# Patient Record
Sex: Female | Born: 1970 | Race: Black or African American | Hispanic: No | Marital: Married | Smoking: Never smoker
Health system: Southern US, Community
[De-identification: ages and names within clinical notes are randomized; demographics above are authoritative.]

## PROBLEM LIST (undated history)

## (undated) DIAGNOSIS — F3181 Bipolar II disorder: Secondary | ICD-10-CM

## (undated) DIAGNOSIS — I10 Essential (primary) hypertension: Secondary | ICD-10-CM

## (undated) DIAGNOSIS — N189 Chronic kidney disease, unspecified: Secondary | ICD-10-CM

## (undated) DIAGNOSIS — G4730 Sleep apnea, unspecified: Secondary | ICD-10-CM

## (undated) DIAGNOSIS — E669 Obesity, unspecified: Secondary | ICD-10-CM

## (undated) DIAGNOSIS — C4492 Squamous cell carcinoma of skin, unspecified: Secondary | ICD-10-CM

## (undated) DIAGNOSIS — E785 Hyperlipidemia, unspecified: Secondary | ICD-10-CM

## (undated) HISTORY — PX: CERVICAL CERCLAGE: SHX1329

## (undated) HISTORY — DX: Bipolar II disorder: F31.81

## (undated) HISTORY — DX: Hyperlipidemia, unspecified: E78.5

## (undated) HISTORY — PX: KIDNEY TRANSPLANT: SHX239

## (undated) HISTORY — DX: Squamous cell carcinoma of skin, unspecified: C44.92

## (undated) HISTORY — DX: Obesity, unspecified: E66.9

---

## 2010-11-11 DIAGNOSIS — N19 Unspecified kidney failure: Secondary | ICD-10-CM | POA: Diagnosis present

## 2010-11-11 DIAGNOSIS — N920 Excessive and frequent menstruation with regular cycle: Secondary | ICD-10-CM | POA: Diagnosis present

## 2010-12-25 ENCOUNTER — Other Ambulatory Visit: Payer: Self-pay

## 2010-12-25 ENCOUNTER — Encounter (HOSPITAL_COMMUNITY)
Admission: RE | Admit: 2010-12-25 | Discharge: 2010-12-25 | Disposition: A | Discharge status: Home / Self Care | Payer: BC Managed Care – PPO | Source: Ambulatory Visit | Attending: Obstetrics and Gynecology | Admitting: Obstetrics and Gynecology

## 2010-12-25 ENCOUNTER — Encounter (HOSPITAL_COMMUNITY): Payer: Self-pay

## 2010-12-25 HISTORY — DX: Sleep apnea, unspecified: G47.30

## 2010-12-25 HISTORY — DX: Chronic kidney disease, unspecified: N18.9

## 2010-12-25 HISTORY — DX: Essential (primary) hypertension: I10

## 2010-12-25 LAB — CBC
HCT: 32.6 % — ABNORMAL LOW (ref 36.0–46.0)
MCH: 30.4 pg (ref 26.0–34.0)
MCHC: 31.3 g/dL (ref 30.0–36.0)
MCV: 97 fL (ref 78.0–100.0)
Platelets: 223 10*3/uL (ref 150–400)
RDW: 12.9 % (ref 11.5–15.5)
WBC: 6 10*3/uL (ref 4.0–10.5)

## 2010-12-25 LAB — BASIC METABOLIC PANEL
BUN: 42 mg/dL — ABNORMAL HIGH (ref 6–23)
CO2: 24 mEq/L (ref 19–32)
Calcium: 9.4 mg/dL (ref 8.4–10.5)
Chloride: 102 mEq/L (ref 96–112)
Creatinine, Ser: 4.34 mg/dL — ABNORMAL HIGH (ref 0.50–1.10)

## 2010-12-25 NOTE — Patient Instructions (Signed)
   Your procedure is scheduled on:01/01/11  Enter through the Main Entrance of [facility] at:0600 Pick up the phone at the desk and dial [phone] and inform us of your arrival  Please call this number if you have any problems the morning of surgery: [phone]  Remember: Do not eat food after midnight  Do not drink clear liquids after:midnight Take these medicines the morning of surgery with a SIP OF WATER: take all BP meds  Do not wear jewelry, make-up, or FINGER nail polish Do not wear lotions, powders, or perfumes.  You may wear deodorant. Do not shave 48 hours prior to surgery. Do not bring valuables to the hospital. Leave suitcase in the car. After Surgery it may be brought to your room. For patients being admitted to the hospital, checkout time is 11:00am the day of discharge.  Patients discharged on the day of surgery will not be allowed to drive home.   Name and phone number of your driver: Marlan Palau- [phone]   Remember to use your hibiclens as instructed.Please shower with 1/2 bottle the evening before your surgery and the other 1/2 bottle the morning of surgery.

## 2010-12-25 NOTE — Pre-Procedure Instructions (Signed)
Records from nephrologist are in hard chart under "anesthesia" tab.

## 2010-12-27 LAB — MRSA CULTURE

## 2010-12-30 NOTE — Pre-Procedure Instructions (Signed)
12/25/10 PCR results invalid- spec sent for culture. On 12/29/10, I checked EPIC chart for culture results and could not discern the results so I went to echart - results read"  no MRSA No Staph.aureus isolated. '

## 2011-01-01 ENCOUNTER — Encounter (HOSPITAL_COMMUNITY)
Admission: RE | Disposition: A | Discharge status: Home / Self Care | Payer: Self-pay | Source: Ambulatory Visit | Attending: Obstetrics and Gynecology

## 2011-01-01 ENCOUNTER — Encounter (HOSPITAL_COMMUNITY): Payer: Self-pay | Admitting: Anesthesiology

## 2011-01-01 ENCOUNTER — Ambulatory Visit (HOSPITAL_COMMUNITY): Payer: BC Managed Care – PPO | Admitting: Anesthesiology

## 2011-01-01 ENCOUNTER — Ambulatory Visit (HOSPITAL_COMMUNITY)
Admission: RE | Admit: 2011-01-01 | Discharge: 2011-01-01 | Disposition: A | Discharge status: Home / Self Care | Payer: BC Managed Care – PPO | Source: Ambulatory Visit | Attending: Obstetrics and Gynecology | Admitting: Obstetrics and Gynecology

## 2011-01-01 ENCOUNTER — Other Ambulatory Visit: Payer: Self-pay | Admitting: Obstetrics and Gynecology

## 2011-01-01 DIAGNOSIS — Z01818 Encounter for other preprocedural examination: Secondary | ICD-10-CM | POA: Insufficient documentation

## 2011-01-01 DIAGNOSIS — N19 Unspecified kidney failure: Secondary | ICD-10-CM | POA: Diagnosis present

## 2011-01-01 DIAGNOSIS — Z01812 Encounter for preprocedural laboratory examination: Secondary | ICD-10-CM | POA: Insufficient documentation

## 2011-01-01 DIAGNOSIS — I10 Essential (primary) hypertension: Secondary | ICD-10-CM | POA: Insufficient documentation

## 2011-01-01 DIAGNOSIS — Z302 Encounter for sterilization: Secondary | ICD-10-CM | POA: Insufficient documentation

## 2011-01-01 DIAGNOSIS — N920 Excessive and frequent menstruation with regular cycle: Secondary | ICD-10-CM | POA: Diagnosis present

## 2011-01-01 HISTORY — PX: LAPAROSCOPIC TUBAL LIGATION: SHX1937

## 2011-01-01 HISTORY — PX: HYSTEROSCOPY WITH D & C: SHX1775

## 2011-01-01 SURGERY — LIGATION, FALLOPIAN TUBE, LAPAROSCOPIC
Anesthesia: General | Site: Vagina | Wound class: Clean Contaminated

## 2011-01-01 MED ORDER — NEOSTIGMINE METHYLSULFATE 1 MG/ML IJ SOLN
INTRAMUSCULAR | Status: AC
  Filled 2011-01-01: qty 10

## 2011-01-01 MED ORDER — PROPOFOL 10 MG/ML IV EMUL
INTRAVENOUS | Status: AC
  Filled 2011-01-01: qty 20

## 2011-01-01 MED ORDER — GLYCINE 1.5 % IR SOLN
Status: DC | PRN
  Administered 2011-01-01: 3000 mL

## 2011-01-01 MED ORDER — SUCCINYLCHOLINE CHLORIDE 20 MG/ML IJ SOLN
INTRAMUSCULAR | Status: AC
  Filled 2011-01-01: qty 1

## 2011-01-01 MED ORDER — ONDANSETRON HCL 4 MG/2ML IJ SOLN
INTRAMUSCULAR | Status: DC | PRN
  Administered 2011-01-01: 4 mg via INTRAVENOUS

## 2011-01-01 MED ORDER — BUPIVACAINE HCL (PF) 0.25 % IJ SOLN
INTRAMUSCULAR | Status: DC | PRN
  Administered 2011-01-01: 5 mL
  Administered 2011-01-01: 12 mL

## 2011-01-01 MED ORDER — ACETAMINOPHEN 10 MG/ML IV SOLN
1000.0000 mg | Freq: Once | INTRAVENOUS | Status: AC | PRN
  Administered 2011-01-01: 1000 mg via INTRAVENOUS
  Filled 2011-01-01: qty 100

## 2011-01-01 MED ORDER — GLYCOPYRROLATE 0.2 MG/ML IJ SOLN
INTRAMUSCULAR | Status: DC | PRN
  Administered 2011-01-01: 0.1 mg via INTRAVENOUS

## 2011-01-01 MED ORDER — DEXAMETHASONE SODIUM PHOSPHATE 10 MG/ML IJ SOLN
INTRAMUSCULAR | Status: AC
  Filled 2011-01-01: qty 1

## 2011-01-01 MED ORDER — ONDANSETRON HCL 4 MG/2ML IJ SOLN
INTRAMUSCULAR | Status: AC
  Filled 2011-01-01: qty 2

## 2011-01-01 MED ORDER — GLYCOPYRROLATE 0.2 MG/ML IJ SOLN
INTRAMUSCULAR | Status: AC
  Filled 2011-01-01: qty 2

## 2011-01-01 MED ORDER — FENTANYL CITRATE 0.05 MG/ML IJ SOLN
INTRAMUSCULAR | Status: AC
  Filled 2011-01-01: qty 5

## 2011-01-01 MED ORDER — DEXAMETHASONE SODIUM PHOSPHATE 10 MG/ML IJ SOLN
INTRAMUSCULAR | Status: DC | PRN
  Administered 2011-01-01: 10 mg via INTRAVENOUS

## 2011-01-01 MED ORDER — MIDAZOLAM HCL 2 MG/2ML IJ SOLN
INTRAMUSCULAR | Status: AC
  Filled 2011-01-01: qty 2

## 2011-01-01 MED ORDER — LACTATED RINGERS IV SOLN
INTRAVENOUS | Status: DC
  Administered 2011-01-01: 07:00:00 via INTRAVENOUS

## 2011-01-01 MED ORDER — PROPOFOL 10 MG/ML IV EMUL
INTRAVENOUS | Status: DC | PRN
  Administered 2011-01-01: 200 mg via INTRAVENOUS
  Administered 2011-01-01: 100 mg via INTRAVENOUS

## 2011-01-01 MED ORDER — LIDOCAINE HCL (CARDIAC) 20 MG/ML IV SOLN
INTRAVENOUS | Status: AC
  Filled 2011-01-01: qty 5

## 2011-01-01 MED ORDER — FENTANYL CITRATE 0.05 MG/ML IJ SOLN
INTRAMUSCULAR | Status: DC | PRN
  Administered 2011-01-01 (×3): 50 ug via INTRAVENOUS

## 2011-01-01 MED ORDER — LIDOCAINE HCL (CARDIAC) 20 MG/ML IV SOLN
INTRAVENOUS | Status: DC | PRN
  Administered 2011-01-01: 100 mg via INTRAVENOUS

## 2011-01-01 MED ORDER — CEFAZOLIN SODIUM 1-5 GM-% IV SOLN
INTRAVENOUS | Status: DC | PRN
  Administered 2011-01-01: 1 g via INTRAVENOUS

## 2011-01-01 MED ORDER — CEFAZOLIN SODIUM 1-5 GM-% IV SOLN
INTRAVENOUS | Status: AC
  Filled 2011-01-01: qty 50

## 2011-01-01 MED ORDER — ROCURONIUM BROMIDE 50 MG/5ML IV SOLN
INTRAVENOUS | Status: AC
  Filled 2011-01-01: qty 1

## 2011-01-01 MED ORDER — MIDAZOLAM HCL 5 MG/5ML IJ SOLN
INTRAMUSCULAR | Status: DC | PRN
  Administered 2011-01-01: 1 mg via INTRAVENOUS

## 2011-01-01 MED ORDER — LIDOCAINE HCL 1 % IJ SOLN
INTRAMUSCULAR | Status: DC | PRN
  Administered 2011-01-01: 20 mL

## 2011-01-01 SURGICAL SUPPLY — 27 items
CANISTER SUCTION 2500CC (MISCELLANEOUS) ×3 IMPLANT
CATH ROBINSON RED A/P 16FR (CATHETERS) ×3 IMPLANT
CLIP FILSHIE TUBAL LIGA STRL (Clip) ×3 IMPLANT
CLOTH BEACON ORANGE TIMEOUT ST (SAFETY) ×3 IMPLANT
CONTAINER PREFILL 10% NBF 60ML (FORM) ×3 IMPLANT
DERMABOND ADVANCED (GAUZE/BANDAGES/DRESSINGS) ×1
DERMABOND ADVANCED .7 DNX12 (GAUZE/BANDAGES/DRESSINGS) ×2 IMPLANT
DRAPE UTILITY XL STRL (DRAPES) ×3 IMPLANT
ELECT REM PT RETURN 9FT ADLT (ELECTROSURGICAL)
ELECTRODE REM PT RTRN 9FT ADLT (ELECTROSURGICAL) IMPLANT
GLOVE BIO SURGEON STRL SZ8 (GLOVE) ×6 IMPLANT
GOWN PREVENTION PLUS LG XLONG (DISPOSABLE) ×3 IMPLANT
GOWN STRL REIN XL XLG (GOWN DISPOSABLE) ×3 IMPLANT
LOOP ANGLED CUTTING 22FR (CUTTING LOOP) IMPLANT
NEEDLE INSUFFLATION 14GA 120MM (NEEDLE) ×3 IMPLANT
PACK HYSTEROSCOPY LF (CUSTOM PROCEDURE TRAY) ×3 IMPLANT
PACK LAPAROSCOPY BASIN (CUSTOM PROCEDURE TRAY) ×3 IMPLANT
STRIP CLOSURE SKIN 1/4X3 (GAUZE/BANDAGES/DRESSINGS) ×3 IMPLANT
SUT VIC AB 3-0 PS2 18 (SUTURE) ×1
SUT VIC AB 3-0 PS2 18XBRD (SUTURE) ×2 IMPLANT
SUT VICRYL 0 UR6 27IN ABS (SUTURE) IMPLANT
SYR 30ML LL (SYRINGE) ×3 IMPLANT
TOWEL OR 17X24 6PK STRL BLUE (TOWEL DISPOSABLE) ×6 IMPLANT
TROCAR XCEL NON-BLD 11X100MML (ENDOMECHANICALS) ×3 IMPLANT
TROCAR XCEL NON-BLD 5MMX100MML (ENDOMECHANICALS) ×3 IMPLANT
WARMER LAPAROSCOPE (MISCELLANEOUS) ×3 IMPLANT
WATER STERILE IRR 1000ML POUR (IV SOLUTION) ×3 IMPLANT

## 2011-01-01 NOTE — Anesthesia Procedure Notes (Addendum)
Performed by: Karleen Dolphin   Procedure Name: Intubation Date/Time: 01/01/2011 7:41 AM Performed by: Karleen Dolphin Pre-anesthesia Checklist: Patient identified, Timeout performed, Emergency Drugs available, Suction available and Patient being monitored Patient Re-evaluated:Patient Re-evaluated prior to inductionOxygen Delivery Method: Circle System Utilized Preoxygenation: Pre-oxygenation with 100% oxygen Intubation Type: IV induction Ventilation: Mask ventilation without difficulty Laryngoscope Size: Mac and 3 Grade View: Grade I Tube type: Oral Tube size: 7.0 mm Number of attempts: 1 Airway Equipment and Method: stylet Placement Confirmation: ETT inserted through vocal cords under direct vision,  positive ETCO2 and breath sounds checked- equal and bilateral Secured at: 21 cm Tube secured with: Tape Dental Injury: Teeth and Oropharynx as per pre-operative assessment

## 2011-01-01 NOTE — H&P (Signed)
  40 yo with heavy menses and U/S C/W probable endometrial polyp desires permanent sterilization. Risks D/W pt preoperatively. PMH-CHTN, Kidney dx, depression Meds - nifedipine, labetolol, lipitor, depakote, torsemide, levocarnitine NKDA  VSS Afeb Lungs CTA Cor RRR Abd soft NT Ut upper normal size, NT  A: Menorrhagia     Desires permanent sterilization  P: H/S, D&C, L/S with BTL

## 2011-01-01 NOTE — Transfer of Care (Signed)
Immediate Anesthesia Transfer of Care Note  Patient: Carolyn Chang  Procedure(s) Performed:  LAPAROSCOPIC TUBAL LIGATION - Lysis of Adhesions; DILATATION AND CURETTAGE (D&C) /HYSTEROSCOPY  Patient Location: PACU  Anesthesia Type: General  Level of Consciousness: awake, alert  and oriented  Airway & Oxygen Therapy: Patient Spontanous Breathing and Patient connected to nasal cannula oxygen  Post-op Assessment: Report given to PACU RN and Post -op Vital signs reviewed and stable  Post vital signs: Reviewed and stable  Complications: No apparent anesthesia complications

## 2011-01-01 NOTE — Anesthesia Preprocedure Evaluation (Signed)
Anesthesia Evaluation  Name, MR# and DOB Patient awake  General Assessment Comment  Reviewed: Allergy & Precautions, H&P , NPO status , Patient's Chart, lab work & pertinent test results, reviewed documented beta blocker date and time   Airway Mallampati: II TM Distance: >3 FB Neck ROM: full    Dental No notable dental hx. (+) Teeth Intact   Pulmonary sleep apnea    Pulmonary exam normal       Cardiovascular hypertension, Pt. on home beta blockers and Pt. on medications     Neuro/Psych Negative Neurological ROS  Negative Psych ROS   GI/Hepatic negative GI ROS Neg liver ROS    Endo/Other  Negative Endocrine ROS  Renal/GU CRFRenal disease  Genitourinary negative   Musculoskeletal negative musculoskeletal ROS (+)   Abdominal Normal abdominal exam  (+)   Peds negative pediatric ROS (+)  Hematology negative hematology ROS (+)   Anesthesia Other Findings   Reproductive/Obstetrics negative OB ROS                           Anesthesia Physical Anesthesia Plan  ASA: III  Anesthesia Plan: General   Post-op Pain Management:    Induction: Intravenous  Airway Management Planned: Oral ETT  Additional Equipment:   Intra-op Plan:   Post-operative Plan: Extubation in OR  Informed Consent: I have reviewed the patients History and Physical, chart, labs and discussed the procedure including the risks, benefits and alternatives for the proposed anesthesia with the patient or authorized representative who has indicated his/her understanding and acceptance.   Dental Advisory Given  Plan Discussed with: CRNA  Anesthesia Plan Comments:         Anesthesia Quick Evaluation

## 2011-01-01 NOTE — Op Note (Signed)
Carolyn Chang, Carolyn Chang            ACCOUNT NO.:  [id number]  MEDICAL RECORD NO.:  [id number]  LOCATION:  [facility]                          FACILITY:  [facility]  PHYSICIAN:  Guy Sandifer. Henderson Cloud, M.D. DATE OF BIRTH:  1970/04/19  DATE OF PROCEDURE:  01/01/2011 DATE OF DISCHARGE:                              OPERATIVE REPORT   PREOPERATIVE DIAGNOSES: 1. Menorrhagia. 2. Desires permanent sterilization.  POSTOPERATIVE DIAGNOSES: 1. Menorrhagia. 2. Desires permanent sterilization. 3. Pelvic adhesions.  PROCEDURE: 1. Laparoscopy with bilateral tubal ligation with Filshie clips and     lysis of adhesions. 2. Hysteroscopy with dilatation and curettage.  SURGEON:  Guy Sandifer. Henderson Cloud, MD.  ANESTHESIA:  General endotracheal intubation, Dana Allan, MD.  ESTIMATED BLOOD LOSS:  Less than 50 mL.  I and O's of distending media 5 mL deficit.  SPECIMENS:  Endometrial curettings to pathology.  INDICATIONS AND CONSENT:  This patient is a 40 year old, black female with increasingly heavy menses.  Ultrasound is consistent with probable endometrial polyp.  She also desires permanent sterilization.  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  Hysteroscopy, D and C, laparoscopy with tubal ligation was discussed with the patient preoperatively.  Potential risks and complications were discussed preoperatively including not limited to infection, organ damage, bleeding, requiring transfusion of blood products with HIV and hepatitis acquisition, DVT, PE, pneumonia, uterine perforation, pelvic pain, painful intercourse.  Potential issues with fluid overload were carefully reviewed with the patient as well.  All questions were answered and consent is signed on the chart.  FINDINGS:  Upper abdomen is grossly normal.  There is a 3-cm subserosal fibroid on the right superior uterine fundus.  Tubes and ovaries, anterior and posterior cul-de-sacs were normal bilaterally.  There is  a single band of adhesions from the left ovary to the left pelvic sidewall.  On the endometrial cavity, both fallopian tube ostia identified.  Approximately 9-mm polypoid type structures noted on the left upper anterior endometrial cavity.  DESCRIPTION OF PROCEDURE:  The patient was taken to the operating room, where she was identified, placed in dorsal supine position.  General anesthesia was induced via endotracheal intubation.  She was placed in a dorsal lithotomy position.  The patient's renal status is discussed with pharmacy who agrees that 1 g of Ancef is appropriate dosing for prophylaxis.  Time-out undertaken.  She is prepped abdominally and vaginally, bladder straight catheterized, and she draped in a sterile fashion.  Bivalve speculum was placed.  Anterior cervical lip was injected with 1% plain lidocaine and grasped with single-tooth tenaculum.  Paracervical block was placed at 2, 4, 5, 7, 8, and 10 o'clock positions with approximately 20 mL of the saline solution. Cervix was gently progressively dilated.  Diagnostic hysteroscope was placed in endocervical canal, advanced under direct visualization using distending media.  The above findings were noted.  Hysteroscope was withdrawn and sharp curettage was carried out.  The hysteroscope was replaced in the endocervical canal again advanced under direct visualization and inspection reveals the cavity to be clean.  Single- tooth tenaculum was replaced with the Hulka tenaculum.  Attention was turned to the abdomen.  The infraumbilical and suprapubic areas were injected in midline with 0.25%  plain Marcaine.  Small infraumbilical incision was made and disposable Veress needle was placed on the first attempt without difficulty.  Placement verified with a syringe and drop test.  2 L of gas were then insufflated under low pressure with good tympany in the right upper quadrant.  Veress needle was removed and 10/11 XL bladeless  disposable trocar sleeve was placed using direct visualization without difficulty.  After placement, the operative laparoscope was used.  Small suprapubic incision was made in the midline and a 5-mm disposable trocar sleeve was placed under direct visualization without difficulty.  The above findings were noted.  The band of adhesions of the left ovary is cauterized with bipolar cautery and the adhesion was taken down without difficulty.  Right fallopian tube was then identified from cornu to fimbria.  Filshie clip was placed in the proximal one third of the tube.  Similar procedure was carried out on the left side.  Filshie clip applicator was removed and reinspection revealed the clip to be on the fallopian tube.  The full width of the tube to be within the clip and the heel of the clip can be visualized through the mesosalpinx bilaterally.  Good hemostasis was noted all around.  Approximately 17 mL of 0.25% Marcaine were then instilled into the peritoneal cavity.  Suprapubic trocar sleeve was removed.  Pneumoperitoneum was reduced and the umbilical trocar sleeve was removed.  The skin of the umbilical incision was closed with subcuticular 2-0 Vicryl suture.  Glue was placed on both incisions.  The Hulka tenaculum was removed.  All counts correct.  The patient was awakened, taken to recovery room in stable condition.     Guy Sandifer Henderson Cloud, M.D.     JET/MEDQ  D:  01/01/2011  T:  01/01/2011  Job:  [id number]

## 2011-01-01 NOTE — Brief Op Note (Signed)
01/01/2011  8:34 AM  PATIENT:  Carolyn Chang  40 y.o. female with menorrhagia who desires permanent sterilization  PRE-OPERATIVE DIAGNOSIS:  Menorrhagia, Desires Sterilization  POST-OPERATIVE DIAGNOSIS:  Menorrhagia, Desires Sterilization, pelvic adhesions  PROCEDURE:  Procedure(s): LAPAROSCOPIC TUBAL LIGATION, lysis of adhesions DILATATION AND CURETTAGE (D&C) /HYSTEROSCOPY,   SURGEON:  Surgeon(s): Roselle Locus II  PHYSICIAN ASSISTANT:   ASSISTANTS: none   ANESTHESIA:   general  EBL:  Total I/O In: 300 [I.V.:300] Out: -   BLOOD ADMINISTERED:none  DRAINS: none   LOCAL MEDICATIONS USED:  MARCAINE 17CC and LIDOCAINE 20CC  SPECIMEN:  Source of Specimen:  endometrial currettings  DISPOSITION OF SPECIMEN:  PATHOLOGY  COUNTS:  YES  TOURNIQUET:  * No tourniquets in log *  DICTATION: .Other Dictation: Dictation Number [id number]  PLAN OF CARE: Discharge to home after PACU  PATIENT DISPOSITION:  PACU - hemodynamically stable.   Delay start of Pharmacological VTE agent (>24hrs) due to surgical blood loss or risk of bleeding:  not applicable

## 2011-01-02 NOTE — Anesthesia Postprocedure Evaluation (Signed)
Anesthesia Post Note  Patient: Carolyn Chang  Procedure(s) Performed:  LAPAROSCOPIC TUBAL LIGATION - Lysis of Adhesions; DILATATION AND CURETTAGE (D&C) /HYSTEROSCOPY  Anesthesia type: General  Patient location: PACU  Post pain: Pain level controlled  Post assessment: Post-op Vital signs reviewed  Last Vitals:  Filed Vitals:   01/01/11 0935  BP:   Pulse: 100  Temp:   Resp: 16    Post vital signs: Reviewed  Level of consciousness: sedated  Complications: No apparent anesthesia complications

## 2011-01-04 ENCOUNTER — Encounter (HOSPITAL_COMMUNITY): Payer: Self-pay | Admitting: Obstetrics and Gynecology

## 2011-09-14 DIAGNOSIS — D72819 Decreased white blood cell count, unspecified: Secondary | ICD-10-CM | POA: Insufficient documentation

## 2011-12-30 DIAGNOSIS — B259 Cytomegaloviral disease, unspecified: Secondary | ICD-10-CM | POA: Insufficient documentation

## 2012-11-25 ENCOUNTER — Ambulatory Visit (INDEPENDENT_AMBULATORY_CARE_PROVIDER_SITE_OTHER): Payer: BC Managed Care – PPO | Admitting: Family Medicine

## 2012-11-25 ENCOUNTER — Encounter: Payer: Self-pay | Admitting: Family Medicine

## 2012-11-25 VITALS — BP 136/90 | HR 78 | Resp 16 | Ht 63.0 in | Wt 242.0 lb

## 2012-11-25 DIAGNOSIS — R609 Edema, unspecified: Secondary | ICD-10-CM

## 2012-11-25 DIAGNOSIS — I10 Essential (primary) hypertension: Secondary | ICD-10-CM | POA: Insufficient documentation

## 2012-11-25 DIAGNOSIS — D631 Anemia in chronic kidney disease: Secondary | ICD-10-CM | POA: Insufficient documentation

## 2012-11-25 DIAGNOSIS — Z940 Kidney transplant status: Secondary | ICD-10-CM

## 2012-11-25 DIAGNOSIS — Z23 Encounter for immunization: Secondary | ICD-10-CM

## 2012-11-25 DIAGNOSIS — N185 Chronic kidney disease, stage 5: Secondary | ICD-10-CM | POA: Insufficient documentation

## 2012-11-25 MED ORDER — PHENTERMINE-TOPIRAMATE ER 7.5-46 MG PO CP24: 1.0000 | ORAL_CAPSULE | Freq: Every day | ORAL | Status: DC

## 2012-11-25 MED ORDER — PHENTERMINE-TOPIRAMATE ER 3.75-23 MG PO CP24: 1.0000 | ORAL_CAPSULE | Freq: Every day | ORAL | Status: DC

## 2012-11-25 MED ORDER — CLONIDINE HCL 0.1 MG PO TABS: 0.1000 mg | ORAL_TABLET | Freq: Two times a day (BID) | ORAL | Status: DC

## 2012-11-25 NOTE — Progress Notes (Signed)
Subjective:    Patient ID: Carolyn Chang, female    DOB: [birth year], 42 y.o.   MRN: [id number]  HPI  [name] is here today to re-establish care with our practice.  She was last seen at [facility] in 2009.  Since her last visit, she has undergone a kidney transplant.  She had developed kidney failure due to years of Lithium treatment for her bipolar disorder.  She has done well since receiving her new kidney.  Her only concern today is her weight.  She struggles with losing and knows that she needs to lose to improve her overall health.  She would like to get her flu shot today.    Review of Systems  Constitutional: Negative.   HENT: Negative.   Eyes: Negative.   Respiratory: Negative.   Cardiovascular: Negative.   Gastrointestinal: Negative.   Endocrine: Negative.   Genitourinary: Negative.   Musculoskeletal: Negative.   Skin: Negative.   Allergic/Immunologic: Negative.   Neurological: Negative.   Hematological: Negative.   Psychiatric/Behavioral: Negative.      Past Medical History  Diagnosis Date  . Hypertension   . Sleep apnea     CPAP  . Chronic kidney disease     Seconday to Lithium Treatment   . Hyperlipidemia   . Obesity   . Bipolar 2 disorder      Past Surgical History  Procedure Laterality Date  . Cervical cerclage    . Laparoscopic tubal ligation  01/01/2011    Procedure: LAPAROSCOPIC TUBAL LIGATION;  Surgeon: Roselle Locus II;  Location: [facility];  Service: Gynecology;  Laterality: Bilateral;  Lysis of Adhesions  . Hysteroscopy w/d&c  01/01/2011    Procedure: DILATATION AND CURETTAGE (D&C) /HYSTEROSCOPY;  Surgeon: Roselle Locus II;  Location: [facility];  Service: Gynecology;  Laterality: N/A;  . Kidney transplant Right      Family History  Problem Relation Age of Onset  . Arthritis Father   . Cirrhosis Father   . Lung disease Sister   . Diabetes Maternal Grandmother      History   Social History Narrative   Marital Status:   Married Marlan Palau)   Children:  Daughter Max Sane)    Pets: None    Living Situation: Lives with husband and daughter.   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 atorvastatin (LIPITOR) 20 MG tablet Take 20 mg by mouth daily.        . Coenzyme Q10 (COQ10 PO) Take 1 capsule by mouth daily.        . divalproex (DEPAKOTE ER) 500 MG 24 hr tablet Take 1,000 mg by mouth daily.        . ferrous sulfate 325 (65 FE) MG tablet Take 325 mg by mouth daily with breakfast.        . labetalol (NORMODYNE) 100 MG tablet Take 200 mg by mouth 3 (three) times daily.       . Multiple Vitamins-Minerals (MULTIVITAMINS THER. W/MINERALS) TABS Take 1 tablet by mouth daily.  No current facility-administered medications on file prior to visit.       Objective:   Physical Exam  Vitals reviewed. Constitutional: She is oriented to person, place, and time. She appears well-developed and well-nourished.  Eyes: Conjunctivae are normal. No scleral icterus.  Neck: Neck supple. No thyromegaly present.  Cardiovascular: Normal rate, regular rhythm and normal heart sounds.   Pulmonary/Chest: Effort normal and breath sounds normal.  Musculoskeletal: She exhibits no edema and no tenderness.  Lymphadenopathy:    She has no cervical adenopathy.  Neurological: She is alert and oriented to person, place, and time.  Skin: Skin is warm and dry.  Psychiatric: She has a normal mood and affect. Her behavior is normal. Judgment and thought content normal.          Assessment & Plan:

## 2012-11-25 NOTE — Patient Instructions (Addendum)
1)  BP - You might consider adding some Clonidine .1 mg to .2 mg twice a day to your Labetalol twice a day.  2)  Weight - You might consider doing the HCG diet after reading the book "Pounds & Inches".  You might also consider doing Qsymia to help with your appetite.

## 2013-01-29 ENCOUNTER — Encounter: Payer: Self-pay | Admitting: Family Medicine

## 2013-01-29 DIAGNOSIS — E669 Obesity, unspecified: Secondary | ICD-10-CM | POA: Insufficient documentation

## 2013-01-29 DIAGNOSIS — R609 Edema, unspecified: Secondary | ICD-10-CM | POA: Insufficient documentation

## 2013-01-29 DIAGNOSIS — Z940 Kidney transplant status: Secondary | ICD-10-CM | POA: Insufficient documentation

## 2013-01-29 DIAGNOSIS — Z23 Encounter for immunization: Secondary | ICD-10-CM | POA: Insufficient documentation

## 2013-01-29 NOTE — Assessment & Plan Note (Signed)
The patient confirmed that they are not allergic to eggs and have never had a bad reaction with the flu shot in the past.  The vaccination was given without difficulty.   

## 2013-01-29 NOTE — Assessment & Plan Note (Addendum)
Carolyn Chang's BP is higher than I would like for it to be on just the labetolol so I added some Clonidine for her to try to get her diastolic value lower.  We should be able to stop the additional medication once we are able to get some weight off of her.

## 2013-01-29 NOTE — Assessment & Plan Note (Addendum)
We discussed options for her to lose weight including the HCG diet and Qsymia.

## 2013-01-29 NOTE — Assessment & Plan Note (Signed)
She appears to be stable on her current medications.

## 2013-05-05 ENCOUNTER — Ambulatory Visit (INDEPENDENT_AMBULATORY_CARE_PROVIDER_SITE_OTHER): Payer: BC Managed Care – PPO

## 2013-05-05 VITALS — BP 142/75 | HR 96 | Resp 16 | Ht 63.0 in | Wt 243.0 lb

## 2013-05-05 DIAGNOSIS — M722 Plantar fascial fibromatosis: Secondary | ICD-10-CM

## 2013-05-05 DIAGNOSIS — M79609 Pain in unspecified limb: Secondary | ICD-10-CM

## 2013-05-05 DIAGNOSIS — M79673 Pain in unspecified foot: Secondary | ICD-10-CM

## 2013-05-05 NOTE — Progress Notes (Signed)
   Subjective:    Patient ID: Carolyn Chang, female    DOB: 1970-07-11, 43 y.o.   MRN: [id number]  HPI Comments: "The arch of my foot hurts"  Patient C/O of arch pain right foot for several months. She does have AM pain. She tries to exercise regularly and notices that the pain has gotten more frequent. She wears NB sneakers. She has not tried any other treatments.     Review of Systems  Cardiovascular: Positive for leg swelling.  All other systems reviewed and are negative.       Objective:   Physical Exam Neurovascular status is intact pedal pulses are palpable DP postal for PT one over 4 bilateral patient plus one edema both ankles is associated with her renal history renal insufficiency and transplant. Otherwise he is doing well patient has to avoid NSAIDs. Dermatologically skin color pigment and hair growth normal absent hair growth diminished distally nails normal trophic open wounds ulcerations. Orthopedic biomechanical exam reveals rectus foot type with significant 3 changes splaying of the forefoot mild digital contractures noted there is tenderness on palpation mid band plantar fascia and medial calcaneal tubercle on the right foot left foot is asymptomatic x-rays confirm thickening plantar fascial structures reveal spurring severe promontory changes are noted. Patient currently wearing shoes are flexible in the mid arch rather than a solid shank.       Assessment & Plan:  Assessment this time is plantar fasciitis/heel spur syndrome right foot this time fascial strapping is applied maintain Tylenol as needed for pain also recommended ice to the heel couple times daily as needed recheck in 2 weeks for followup may be K. for functional orthoses based on progress. Is given suggested crocs or work is Dr. on the house avoid going barefoot at all times reappointed 2 weeks for reevaluation  Harriet Masson DPM

## 2013-05-05 NOTE — Patient Instructions (Signed)
ICE INSTRUCTIONS  Apply ice or cold pack to the affected area at least 3 times a day for 10-15 minutes each time.  You should also use ice after prolonged activity or vigorous exercise.  Do not apply ice longer than 20 minutes at one time.  Always keep a cloth between your skin and the ice pack to prevent burns.  Being consistent and following these instructions will help control your symptoms.  We suggest you purchase a gel ice pack because they are reusable and do bit leak.  Some of them are designed to wrap around the area.  Use the method that works best for you.  Here are some other suggestions for icing.   Use a frozen bag of peas or corn-inexpensive and molds well to your body, usually stays frozen for 10 to 20 minutes.  Wet a towel with cold water and squeeze out the excess until it's damp.  Place in a bag in the freezer for 20 minutes. Then remove and use.  Avoid going barefoot or wearing any flimsier flexible shoes. Around the house recommend crocs or Birkenstock type sandals or clogs

## 2013-05-18 ENCOUNTER — Encounter: Payer: BC Managed Care – PPO | Admitting: Family Medicine

## 2013-05-30 ENCOUNTER — Ambulatory Visit (INDEPENDENT_AMBULATORY_CARE_PROVIDER_SITE_OTHER): Payer: BC Managed Care – PPO

## 2013-05-30 VITALS — BP 167/86 | HR 82 | Resp 12

## 2013-05-30 DIAGNOSIS — M79673 Pain in unspecified foot: Secondary | ICD-10-CM

## 2013-05-30 DIAGNOSIS — M722 Plantar fascial fibromatosis: Secondary | ICD-10-CM

## 2013-05-30 DIAGNOSIS — M79609 Pain in unspecified limb: Secondary | ICD-10-CM

## 2013-05-30 NOTE — Patient Instructions (Signed)

## 2013-05-30 NOTE — Progress Notes (Signed)
   Subjective:    Patient ID: Carolyn Chang, female    DOB: [birth year], 43 y.o.   MRN: [id number]  HPI '' RT FOOT IS DOING MUCH BETTER, BUT STILL HAVE LITTLE PAIN.''    Review of Systems no new changes or findings     Objective:   Physical Exam 3 objective findings unchanged pedal pulses palpable epicritic and proprioceptive sensations intact patient is internal is is to avoid NSAIDs patient is improvement still some slight tenderness in the inferior heel although not nearly as bad as previous presentation she is wearing a pair of [employer] shoes that have a fairly firm shank however just a memory foam Insall. Patient indicates the taping had significant improvement felt better when the tape was in place as a test affected functional biomechanical support and possible improvement and she would be a candidate for functional orthoses at this time.       Assessment & Plan:  Assessment plantar fasciitis/heel spur syndrome right foot plan at this time patient is scant for new set of functional orthotics somewhat flexible orthotic with Spenco top cover and will be contacted in 3 or 4 weeks for orthotic fitting and dispensing when ready in the antrum maintain a firm soled shoe in the future may be candidate for other alternative walking running shoes such as new balance or Brooks. Contact us if there is any flare up to read exacerbations in the interim or possible strapping  Harriet Masson DPM

## 2014-11-30 ENCOUNTER — Other Ambulatory Visit: Payer: Self-pay | Admitting: Obstetrics and Gynecology

## 2014-12-03 LAB — CYTOLOGY - PAP

## 2015-04-06 ENCOUNTER — Ambulatory Visit (INDEPENDENT_AMBULATORY_CARE_PROVIDER_SITE_OTHER): Payer: BLUE CROSS/BLUE SHIELD | Admitting: Family Medicine

## 2015-04-06 VITALS — BP 112/78 | HR 94 | Temp 97.9°F | Resp 20 | Ht 63.5 in | Wt 227.8 lb

## 2015-04-06 DIAGNOSIS — R197 Diarrhea, unspecified: Secondary | ICD-10-CM

## 2015-04-06 MED ORDER — METRONIDAZOLE 250 MG PO TABS: 250.0000 mg | ORAL_TABLET | Freq: Three times a day (TID) | ORAL | Status: DC

## 2015-04-06 NOTE — Addendum Note (Signed)
Addended by: Kyra Manges on: 04/06/2015 01:34 PM   Modules accepted: Orders

## 2015-04-06 NOTE — Progress Notes (Signed)
This chart was scribed for Carolyn Haber, MD by [facility], medical scribe at [facility].The patient was seen in exam room 11 and the patient's care was started at 12:56 PM.  Patient ID: Carolyn Chang MRN: [id number], DOB: [birth year], 44 y.o. Date of Encounter: 04/06/2015  Primary Physician: Ival Bible, MD  Chief Complaint:  Chief Complaint  Patient presents with  . Diarrhea    started dec. 21  . Emesis  . Abdominal Cramping   HPI:  Carolyn Chang is a 45 y.o. female who presents to [facility] complaining of diarrhea, abdominal cramping and occasional vomiting for 3.5 weeks. She describes her BM as watery. Symptoms are worse after eating. Her husband had similar symptoms prior to her onset, but his symptoms have improved. She has tried imodium. No sore throat, fever, blood in the stool. Her nephrologist requests a stool culture. She had kidney transplant in 2008. Works at [employer]. Husband is a Technical sales engineer.   Past Medical History  Diagnosis Date  . Hypertension   . Sleep apnea     CPAP  . Chronic kidney disease     Seconday to Lithium Treatment   . Hyperlipidemia   . Obesity   . Bipolar 2 disorder ([place])      Home Meds: Prior to Admission medications   Medication Sig Start Date End Date Taking? Authorizing Provider  aspirin 81 MG tablet Take 81 mg by mouth daily.   Yes Historical Provider, MD  divalproex (DEPAKOTE ER) 500 MG 24 hr tablet Take 1,000 mg by mouth daily.     Yes Historical Provider, MD  ferrous sulfate 325 (65 FE) MG tablet Take 325 mg by mouth daily with breakfast.     Yes Historical Provider, MD  labetalol (NORMODYNE) 100 MG tablet Take 200 mg by mouth 3 (three) times daily.    Yes Historical Provider, MD  mycophenolate (MYFORTIC) 180 MG EC tablet Take by mouth 4 (four) times daily.   Yes Historical Provider, MD  sulfamethoxazole-trimethoprim (BACTRIM,SEPTRA) 400-80 MG per tablet  Take 1 tablet by mouth 2 (two) times daily. Reported on 04/06/2015   Yes Historical Provider, MD  tacrolimus (PROGRAF) 0.5 MG capsule Take 0.5 mg by mouth 5 (five) times daily.   Yes Historical Provider, MD  Zinc Acetate, Oral, (ZINC ACETATE PO) Take by mouth.   Yes Historical Provider, MD  calcitRIOL (ROCALTROL) 0.25 MCG capsule Take 0.25 mcg by mouth daily. Reported on 04/06/2015    Historical Provider, MD  Magnesium 400 MG CAPS Take 1 capsule by mouth. Reported on 04/06/2015    Historical Provider, MD    Allergies: No Known Allergies  Social History   Social History  . Marital Status: Married    Spouse Name: Carolyn Chang   . Number of Children: 1  . Years of Education: 16   Occupational History  . CUSTOMER SERVICE     [employer]   Social History Main Topics  . Smoking status: Never Smoker   . Smokeless tobacco: Never Used  . Alcohol Use: 0.6 oz/week    1 Glasses of wine per week  . Drug Use: No  . Sexual Activity:    Partners: Male    Birth Control/ Protection: None   Other Topics Concern  . Not on file   Social History Narrative   Marital Status:  Married Carolyn Chang)   Children:  Daughter [name])    Pets: None    Living Situation: Lives  with husband and daughter.   Occupation:  Engineer, [employer])    Education: Dietitian    Tobacco Use/Exposure:  None    Alcohol Use:  Occasional   Drug Use:  None   Diet:  Regular   Exercise:  Four times weekly    Hobbies: Reading                    Review of Systems: Constitutional: negative for chills, fever, night sweats, weight changes, or fatigue  HEENT: negative for vision changes, hearing loss, congestion, rhinorrhea, ST, epistaxis, or sinus pressure Cardiovascular: negative for chest pain or palpitations Respiratory: negative for hemoptysis, wheezing, shortness of breath, or cough Abdominal: negative for constipation. Positive for abdominal pain, vomiting, and  diarrhea. Dermatological: negative for rash Neurologic: negative for headache, dizziness, or syncope All other systems reviewed and are otherwise negative with the exception to those above and in the HPI.  Physical Exam: Blood pressure 112/78, pulse 94, temperature 97.9 F (36.6 C), temperature source Oral, resp. rate 20, height 5' 3.5" (1.613 m), weight 227 lb 12.8 oz (103.329 kg), last menstrual period 03/22/2015, SpO2 98 %., Body mass index is 39.71 kg/(m^2). General: Well developed, well nourished, in no acute distress. Head: Normocephalic, atraumatic, eyes without discharge, sclera non-icteric, nares are without discharge. Bilateral auditory canals clear, TM's are without perforation, pearly grey and translucent with reflective cone of light bilaterally. Oral cavity moist, posterior pharynx without exudate, erythema, peritonsillar abscess, or post nasal drip.  Neck: Supple. No thyromegaly. Full ROM. No lymphadenopathy. Lungs: Clear bilaterally to auscultation without wheezes, rales, or rhonchi. Breathing is unlabored. Heart: RRR with S1 S2. No murmurs, rubs, or gallops appreciated. Abdomen: Soft, non-tender, non-distended. No hepatomegaly. No rebound/guarding. No obvious abdominal masses. Hyperactive bowel sounds, mild diffuse tenderness. Msk:  Strength and tone normal for age. Extremities/Skin: Warm and dry. No clubbing or cyanosis. No edema. No rashes or suspicious lesions. Neuro: Alert and oriented X 3. Moves all extremities spontaneously. Gait is normal. CNII-XII grossly in tact. Psych:  Responds to questions appropriately with a normal affect.   Labs:  ASSESSMENT AND PLAN:  45 y.o. year old female with persistent diarrhea  By signing my name below, I, Nadim Abuhashem, attest that this documentation has been prepared under the direction and in the presence of Carolyn Haber, MD.  Electronically Signed: Lora Havens, medical scribe. 04/06/2015 1:03 PM.    This chart was  scribed in my presence and reviewed by me personally.    ICD-9-CM ICD-10-CM   1. Diarrhea, unspecified type 787.91 R19.7 Fecal lactoferrin     Gastrointestinal Panel by PCR , Stool     metroNIDAZOLE (FLAGYL) 250 MG tablet     Signed, Carolyn Haber, MD

## 2015-04-06 NOTE — Patient Instructions (Signed)

## 2015-04-07 ENCOUNTER — Telehealth: Payer: Self-pay | Admitting: *Deleted

## 2015-04-07 NOTE — Telephone Encounter (Signed)
Pt would like stool results sent to Dr. Dimas Aguas when they come in.

## 2015-04-10 LAB — GASTROINTESTINAL PATHOGEN PANEL PCR
C. difficile Tox A/B, PCR: NEGATIVE
Campylobacter, PCR: NEGATIVE
Cryptosporidium, PCR: NEGATIVE
E coli (ETEC) LT/ST PCR: NEGATIVE
E coli (STEC) stx1/stx2, PCR: NEGATIVE
E coli 0157, PCR: NEGATIVE
Giardia lamblia, PCR: NEGATIVE
Norovirus, PCR: NEGATIVE
Rotavirus A, PCR: NEGATIVE
Salmonella, PCR: NEGATIVE
Shigella, PCR: NEGATIVE

## 2015-04-11 NOTE — Telephone Encounter (Signed)
Please send patient's stool report to Dr. Dimas Aguas

## 2015-04-11 NOTE — Telephone Encounter (Signed)
Done.see labs

## 2017-09-02 ENCOUNTER — Ambulatory Visit: Payer: BLUE CROSS/BLUE SHIELD | Admitting: Podiatry

## 2017-09-16 ENCOUNTER — Encounter

## 2017-09-16 ENCOUNTER — Ambulatory Visit: Payer: BLUE CROSS/BLUE SHIELD | Admitting: Podiatry

## 2017-09-16 ENCOUNTER — Encounter: Payer: Self-pay | Admitting: Podiatry

## 2017-09-16 ENCOUNTER — Ambulatory Visit (INDEPENDENT_AMBULATORY_CARE_PROVIDER_SITE_OTHER): Payer: BLUE CROSS/BLUE SHIELD

## 2017-09-16 VITALS — BP 116/68 | HR 83 | Resp 16

## 2017-09-16 DIAGNOSIS — M2141 Flat foot [pes planus] (acquired), right foot: Secondary | ICD-10-CM | POA: Diagnosis not present

## 2017-09-16 DIAGNOSIS — M722 Plantar fascial fibromatosis: Secondary | ICD-10-CM | POA: Diagnosis not present

## 2017-09-16 DIAGNOSIS — G4733 Obstructive sleep apnea (adult) (pediatric): Secondary | ICD-10-CM | POA: Insufficient documentation

## 2017-09-16 DIAGNOSIS — M2142 Flat foot [pes planus] (acquired), left foot: Secondary | ICD-10-CM

## 2017-09-16 DIAGNOSIS — M76829 Posterior tibial tendinitis, unspecified leg: Secondary | ICD-10-CM | POA: Diagnosis not present

## 2017-09-16 DIAGNOSIS — E559 Vitamin D deficiency, unspecified: Secondary | ICD-10-CM | POA: Insufficient documentation

## 2017-09-16 DIAGNOSIS — Z9989 Dependence on other enabling machines and devices: Secondary | ICD-10-CM

## 2017-09-16 DIAGNOSIS — F319 Bipolar disorder, unspecified: Secondary | ICD-10-CM | POA: Insufficient documentation

## 2017-09-16 DIAGNOSIS — D649 Anemia, unspecified: Secondary | ICD-10-CM | POA: Insufficient documentation

## 2017-09-16 DIAGNOSIS — E785 Hyperlipidemia, unspecified: Secondary | ICD-10-CM | POA: Insufficient documentation

## 2017-09-16 NOTE — Progress Notes (Signed)
Subjective:  Patient ID: Carolyn Chang, female    DOB: [birth year],  MRN: [id number] HPI Chief Complaint  Patient presents with  . Foot Pain    Arch and medial foot right - aching x few years, but went to beach recently and noticed increase in pain, tried exercises and epsom salt soaks-no help, wears superfeet orthotics and sneakers as much as possible  . New Patient (Initial Visit)    47 y.o. female presents with the above complaint.   ROS: Denies fever chills nausea vomiting muscle aches pains.  Denies calf pain back pain chest pain shortness of breath headache.  Past Medical History:  Diagnosis Date  . Bipolar 2 disorder ([place])   . Chronic kidney disease    Seconday to Lithium Treatment   . Hyperlipidemia   . Hypertension   . Obesity   . Sleep apnea    CPAP   Past Surgical History:  Procedure Laterality Date  . CERVICAL CERCLAGE    . HYSTEROSCOPY W/D&C  01/01/2011   Procedure: DILATATION AND CURETTAGE (D&C) /HYSTEROSCOPY;  Surgeon: Shon Millet II;  Location: [facility];  Service: Gynecology;  Laterality: N/A;  . KIDNEY TRANSPLANT Right   . LAPAROSCOPIC TUBAL LIGATION  01/01/2011   Procedure: LAPAROSCOPIC TUBAL LIGATION;  Surgeon: Shon Millet II;  Location: [facility];  Service: Gynecology;  Laterality: Bilateral;  Lysis of Adhesions    Current Outpatient Medications:  .  b complex vitamins tablet, Take 1 tablet by mouth daily., Disp: , Rfl:  .  Calcium Acetate, Phos Binder, (CALCIUM ACETATE PO), Take by mouth., Disp: , Rfl:  .  cycloSPORINE (RESTASIS) 0.05 % ophthalmic emulsion, 1 drop 2 (two) times daily., Disp: , Rfl:  .  losartan (COZAAR) 25 MG tablet, Take 25 mg by mouth daily., Disp: , Rfl:  .  Multiple Vitamin (MULTIVITAMIN) capsule, Take 1 capsule by mouth daily., Disp: , Rfl:  .  sulfamethoxazole-trimethoprim (BACTRIM,SEPTRA) 400-80 MG tablet, Take 1 tablet by mouth 2 (two) times daily., Disp: , Rfl:  .  tacrolimus (PROGRAF) 1 MG capsule, Take 1 mg by mouth 2  (two) times daily., Disp: , Rfl:  .  aspirin 81 MG tablet, Take 81 mg by mouth daily., Disp: , Rfl:  .  atorvastatin (LIPITOR) 10 MG tablet, Take 10 mg by mouth daily., Disp: , Rfl: 3 .  divalproex (DEPAKOTE ER) 500 MG 24 hr tablet, Take 1,000 mg by mouth daily.  , Disp: , Rfl:  .  ferrous sulfate 325 (65 FE) MG tablet, Take 325 mg by mouth daily with breakfast.  , Disp: , Rfl:  .  labetalol (NORMODYNE) 100 MG tablet, Take 200 mg by mouth 3 (three) times daily. , Disp: , Rfl:  .  Magnesium 400 MG CAPS, Take 1 capsule by mouth. Reported on 04/06/2015, Disp: , Rfl:  .  mycophenolate (CELLCEPT) 500 MG tablet, , Disp: , Rfl: 3 .  torsemide (DEMADEX) 20 MG tablet, Take 20 mg by mouth every other day., Disp: , Rfl: 3 .  Zinc Acetate, Oral, (ZINC ACETATE PO), Take by mouth., Disp: , Rfl:   No Known Allergies Review of Systems Objective:   Vitals:   09/16/17 0952  BP: 116/68  Pulse: 83  Resp: 16    General: Well developed, nourished, in no acute distress, alert and oriented x3   Dermatological: Skin is warm, dry and supple bilateral. Nails x 10 are well maintained; remaining integument appears unremarkable at this time. There are no open sores, no preulcerative lesions,  no rash or signs of infection present.  Vascular: Dorsalis Pedis artery and Posterior Tibial artery pedal pulses are 2/4 bilateral with immedate capillary fill time. Pedal hair growth present. No varicosities and no lower extremity edema present bilateral.   Neruologic: Grossly intact via light touch bilateral. Vibratory intact via tuning fork bilateral. Protective threshold with Semmes Wienstein monofilament intact to all pedal sites bilateral. Patellar and Achilles deep tendon reflexes 2+ bilateral. No Babinski or clonus noted bilateral.   Musculoskeletal: No gross boney pedal deformities bilateral. No pain, crepitus, or limitation noted with foot and ankle range of motion bilateral. Muscular strength 5/5 in all groups tested  bilateral.  Gait: Unassisted, Nonantalgic.    Radiographs:  Radiographs taken today do demonstrate pes planus both feet also demonstrate soft tissue increase in density plantar calcaneal insertion site of the right foot.  Medial longitudinal arch collapse.   Assessment & Plan:   Assessment: Plantar fasciitis posterior tibial tendon dysfunction with pes planus.  Plan: Discussed etiology pathology conservative surgical therapies show Medrol Dosepak injected the right heel today after sterile Betadine skin prep with 20 mg Kenalog 5 mg Marcaine plain pressure brace and night splint.  Discussed appropriate shoe gear stretching exercise ice therapy sugar modifications follow-up with her in 1 month she will schedule with Liliane Channel for orthotics     Apolinar Bero T. [place], [place]

## 2017-09-22 ENCOUNTER — Telehealth: Payer: Self-pay | Admitting: Podiatry

## 2017-09-22 NOTE — Telephone Encounter (Signed)
Called pt with orthotic coverage. If billed with office visit and pt pays 20 copay covered at 100%. Pt wants to proceed.

## 2017-10-07 ENCOUNTER — Ambulatory Visit: Payer: BLUE CROSS/BLUE SHIELD | Admitting: Orthotics

## 2017-10-07 DIAGNOSIS — M2141 Flat foot [pes planus] (acquired), right foot: Secondary | ICD-10-CM

## 2017-10-07 DIAGNOSIS — M2142 Flat foot [pes planus] (acquired), left foot: Secondary | ICD-10-CM

## 2017-10-07 DIAGNOSIS — M722 Plantar fascial fibromatosis: Secondary | ICD-10-CM

## 2017-10-07 NOTE — Progress Notes (Signed)
Patient came in today to pick up custom made foot orthotics.  The goals were accomplished and the patient reported no dissatisfaction with said orthotics.  Patient was advised of breakin period and how to report any issues. 

## 2017-10-13 ENCOUNTER — Telehealth: Payer: Self-pay | Admitting: Podiatry

## 2017-10-13 NOTE — Telephone Encounter (Signed)
I'm calling concerning an x-ray that was done on my ankle. Can you call me back at [phone]? Thank you. Bye bye.

## 2017-10-14 NOTE — Telephone Encounter (Signed)
Left message requesting a call back concerning ankle x-ray.

## 2017-10-14 NOTE — Telephone Encounter (Signed)
Pt returned call

## 2017-10-14 NOTE — Telephone Encounter (Signed)
Pt states she wanted to know the results of her ankle x-rays. I reviewed Imaging and pt had only right foot x-ray, explained to pt the foot views were different than the ankle views, if she was having ankle pain she would benefit from being evaluated and ankle x-rays performed.

## 2017-11-02 ENCOUNTER — Encounter: Payer: Self-pay | Admitting: Podiatry

## 2017-11-02 ENCOUNTER — Ambulatory Visit: Payer: BLUE CROSS/BLUE SHIELD | Admitting: Podiatry

## 2017-11-02 DIAGNOSIS — M7751 Other enthesopathy of right foot: Secondary | ICD-10-CM

## 2017-11-02 DIAGNOSIS — M779 Enthesopathy, unspecified: Secondary | ICD-10-CM

## 2017-11-03 NOTE — Progress Notes (Signed)
She presents today for follow-up of her plantar fasciitis pes planus posterior tibial tendinitis she says the foot feels great but the ankle hurts.  She states the orthotics seem to be helping with the foot a lot.  She has pain right here as she points to the sinus tarsi area of the right foot.  Objective: Vital signs are stable she is alert and oriented x3.  Pulses are palpable.  Neurologic sensorium is intact degenerative flexors are intact no pain on palpation posterior tibial tendon no pain on palpation medial calcaneal tubercle.  She does have pain on palpation and range of motion of the subtalar joint.  I evaluated her shoes her shoes are pronated type shoe that do not appear to be in neutral shoe at all this is not working well with her orthotics I feel that an more of a neutral shoe would be best with orthotics as they are set to bring her to neutral.  Otherwise she is remaining pronated.  Assessment sinus tarsitis capsulitis subtalar joint right resolving plantar fasciitis right.  Plan: Discussed etiology pathology conservative surgical therapies discussed appropriate shoe gear with her.  After sterile Betadine skin prep I injected 20 mg Kenalog 5 mg Marcaine point maximal tenderness right sinus tarsi area.  Tolerated procedure well without complications follow-up with me in 1 month.

## 2017-11-30 ENCOUNTER — Ambulatory Visit: Payer: BLUE CROSS/BLUE SHIELD | Admitting: Podiatry

## 2018-01-31 ENCOUNTER — Other Ambulatory Visit: Payer: Self-pay | Admitting: Obstetrics and Gynecology

## 2018-01-31 DIAGNOSIS — R928 Other abnormal and inconclusive findings on diagnostic imaging of breast: Secondary | ICD-10-CM

## 2018-02-03 ENCOUNTER — Ambulatory Visit
Admission: RE | Admit: 2018-02-03 | Discharge: 2018-02-03 | Disposition: A | Discharge status: Home / Self Care | Payer: BLUE CROSS/BLUE SHIELD | Source: Ambulatory Visit | Attending: Obstetrics and Gynecology | Admitting: Obstetrics and Gynecology

## 2018-02-03 DIAGNOSIS — R928 Other abnormal and inconclusive findings on diagnostic imaging of breast: Secondary | ICD-10-CM

## 2018-03-01 ENCOUNTER — Other Ambulatory Visit: Payer: Self-pay | Admitting: Psychiatry

## 2018-03-02 NOTE — Telephone Encounter (Signed)
Review paper chart

## 2018-03-30 ENCOUNTER — Encounter: Payer: Self-pay | Admitting: Emergency Medicine

## 2018-04-19 ENCOUNTER — Telehealth: Payer: Self-pay | Admitting: Podiatry

## 2018-04-19 NOTE — Telephone Encounter (Signed)
I would like a note to be able to wear tennis shoes at work. Im not having pain right now, but Im afraid that if I put on dress shoes at my new job I will begin to have the pain again.

## 2018-04-20 ENCOUNTER — Encounter: Payer: Self-pay | Admitting: *Deleted

## 2018-04-20 NOTE — Telephone Encounter (Signed)
Left message requesting call back with method to send athletic shoe wear letter.

## 2018-04-20 NOTE — Telephone Encounter (Signed)
Emailed

## 2018-04-20 NOTE — Telephone Encounter (Signed)
Pt called with email [email or web address]. routed message for Bethann Humble - Record Coordinator to sent letter.

## 2018-04-26 ENCOUNTER — Other Ambulatory Visit: Payer: Self-pay | Admitting: Cardiology

## 2018-04-26 DIAGNOSIS — I10 Essential (primary) hypertension: Secondary | ICD-10-CM

## 2018-05-04 ENCOUNTER — Ambulatory Visit: Payer: Self-pay | Admitting: Psychiatry

## 2018-05-26 ENCOUNTER — Other Ambulatory Visit: Payer: Self-pay | Admitting: Psychiatry

## 2018-05-26 NOTE — Telephone Encounter (Signed)
Need to review paper chart  

## 2018-07-14 DIAGNOSIS — T8610 Unspecified complication of kidney transplant: Secondary | ICD-10-CM | POA: Insufficient documentation

## 2018-07-14 HISTORY — DX: Unspecified complication of kidney transplant: T86.10

## 2018-07-22 ENCOUNTER — Other Ambulatory Visit: Payer: Self-pay

## 2018-07-22 DIAGNOSIS — I10 Essential (primary) hypertension: Secondary | ICD-10-CM

## 2018-07-22 MED ORDER — AMLODIPINE BESYLATE 5 MG PO TABS: 5.0000 mg | ORAL_TABLET | Freq: Every day | ORAL | 3 refills | Status: DC

## 2018-07-22 NOTE — Telephone Encounter (Signed)
Future refills will be with her PCP.

## 2018-08-26 ENCOUNTER — Other Ambulatory Visit: Payer: Self-pay | Admitting: Psychiatry

## 2018-08-26 NOTE — Telephone Encounter (Signed)
Needs appt, front office aware

## 2018-10-02 ENCOUNTER — Other Ambulatory Visit: Payer: Self-pay | Admitting: Psychiatry

## 2018-10-23 ENCOUNTER — Other Ambulatory Visit: Payer: Self-pay | Admitting: Psychiatry

## 2018-10-25 ENCOUNTER — Other Ambulatory Visit: Payer: Self-pay

## 2018-10-25 ENCOUNTER — Telehealth: Payer: Self-pay | Admitting: Psychiatry

## 2018-10-25 MED ORDER — DIVALPROEX SODIUM ER 500 MG PO TB24: 1000.0000 mg | ORAL_TABLET | Freq: Every day | ORAL | 1 refills | Status: DC

## 2018-10-25 NOTE — Telephone Encounter (Signed)
Refill sent, keep scheduled appt

## 2018-10-25 NOTE — Telephone Encounter (Signed)
Pt called to schedule appt 9/17 and ask for refill on Depakote @ [place]

## 2018-11-18 ENCOUNTER — Other Ambulatory Visit: Payer: Self-pay | Admitting: Psychiatry

## 2018-11-18 NOTE — Telephone Encounter (Signed)
Submitted 08/04 with 1 additional refill

## 2018-12-08 ENCOUNTER — Encounter: Payer: Self-pay | Admitting: Psychiatry

## 2018-12-08 ENCOUNTER — Ambulatory Visit (INDEPENDENT_AMBULATORY_CARE_PROVIDER_SITE_OTHER): Payer: BC Managed Care – PPO | Admitting: Psychiatry

## 2018-12-08 ENCOUNTER — Other Ambulatory Visit: Payer: Self-pay

## 2018-12-08 DIAGNOSIS — F319 Bipolar disorder, unspecified: Secondary | ICD-10-CM

## 2018-12-08 MED ORDER — DIVALPROEX SODIUM ER 500 MG PO TB24: 1000.0000 mg | ORAL_TABLET | Freq: Every day | ORAL | 3 refills | Status: DC

## 2018-12-08 NOTE — Progress Notes (Signed)
Chrysanthe Devane [id number] 12-20-1970 48 y.o.  Virtual Visit via Telephone Note  I connected with pt on 12/08/18 at 11:15 AM EDT by telephone and verified that I am speaking with the correct person using two identifiers.   I discussed the limitations, risks, security and privacy concerns of performing an evaluation and management service by telephone and the availability of in person appointments. I also discussed with the patient that there may be a patient responsible charge related to this service. The patient expressed understanding and agreed to proceed.   I discussed the assessment and treatment plan with the patient. The patient was provided an opportunity to ask questions and all were answered. The patient agreed with the plan and demonstrated an understanding of the instructions.   The patient was advised to call back or seek an in-person evaluation if the symptoms worsen or if the condition fails to improve as anticipated.  I provided 25 minutes of non-face-to-face time during this encounter.  The patient was located at home.  The provider was located at [place].   Purnell Shoemaker, MD   Subjective:   Patient ID:  Berenis Cenac is a 48 y.o. (DOB 12-04-1970) female.  Chief Complaint:  Chief Complaint  Patient presents with  . Follow-up    Medication Management  . Other    Bipolar    HPI Eralynn Tewolde presents for follow-up of bipolar disorder.  Last seen August 2019.  She was stable at the time and no meds were changed.  She had had an episode of mania after a colon cleanse but that resolved early 2019.  Otherwise stable for years.  No problems with Depakote and is consistent.  Doing well.  No Covid.  Working from home and going [place].  Managing and tries to walk in the morning and walks.  Patient reports stable mood and denies depressed or irritable moods.  No mania.  Patient denies any recent difficulty with anxiety.  Patient denies difficulty with  sleep initiation or maintenance. Denies appetite disturbance.  Patient reports that energy and motivation have been good.  Patient denies any difficulty with concentration.  Patient denies any suicidal ideation.  Past Psychiatric Medication Trials: Depakote ER 1000 mg since under our care July 2003. Haldol side effects no history of antidepressants.  Diagnosed bipolar disorder 1995. History lithium toxicity  Review of Systems:  Review of Systems  Neurological: Negative for tremors and weakness.    Medications: I have reviewed the patient's current medications.  Current Outpatient Medications  Medication Sig Dispense Refill  . amLODipine (NORVASC) 5 MG tablet Take 1 tablet (5 mg total) by mouth daily. 90 tablet 3  . aspirin 81 MG tablet Take 81 mg by mouth daily.    Marland Kitchen atorvastatin (LIPITOR) 10 MG tablet Take 10 mg by mouth daily.  3  . b complex vitamins tablet Take 1 tablet by mouth daily.    . Calcium Acetate, Phos Binder, (CALCIUM ACETATE PO) Take by mouth.    . cycloSPORINE (RESTASIS) 0.05 % ophthalmic emulsion 1 drop 2 (two) times daily.    . divalproex (DEPAKOTE ER) 500 MG 24 hr tablet Take 2 tablets (1,000 mg total) by mouth at bedtime. 180 tablet 3  . ferrous sulfate 325 (65 FE) MG tablet Take 325 mg by mouth daily with breakfast.      . influenza vaccine (FLUCELVAX QUADRIVALENT) 0.5 ML injection Flucelvax Quad 2019-2020 (PF) 60 mcg (15 mcg x 4)/0.5 mL IM syringe  TO BE ADMINISTERED BY  PHARMACIST FOR IMMUNIZATION    . labetalol (NORMODYNE) 100 MG tablet Take 200 mg by mouth 2 (two) times daily.     Marland Kitchen losartan (COZAAR) 25 MG tablet Take 50 mg by mouth daily.     . Magnesium 400 MG CAPS Take 1 capsule by mouth. Reported on 04/06/2015    . Multiple Vitamin (MULTIVITAMIN) capsule Take 1 capsule by mouth daily.    . mupirocin ointment (BACTROBAN) 2 %     . mycophenolate (CELLCEPT) 500 MG tablet   3  . spironolactone (ALDACTONE) 25 MG tablet TAKE 1 (ONE) TABLET EVERY MORNING 30 tablet  2  . sulfamethoxazole-trimethoprim (BACTRIM) 400-80 MG tablet sulfamethoxazole 400 mg-trimethoprim 80 mg tablet  TAKE 1 TABLET BY MOUTH EVERY MONDAY, WEDNESDAY, AND FRIDAY    . tacrolimus (PROGRAF) 1 MG capsule Take 1 mg by mouth 2 (two) times daily.    Marland Kitchen torsemide (DEMADEX) 20 MG tablet Take 20 mg by mouth every other day.  3  . Zinc Acetate, Oral, (ZINC ACETATE PO) Take by mouth.     No current facility-administered medications for this visit.     Medication Side Effects: None  Allergies: No Known Allergies  Past Medical History:  Diagnosis Date  . Bipolar 2 disorder ([place])   . Chronic kidney disease    Seconday to Lithium Treatment   . Hyperlipidemia   . Hypertension   . Obesity   . Sleep apnea    CPAP    Family History  Problem Relation Age of Onset  . Arthritis Father   . Cirrhosis Father   . Lung disease Sister   . Diabetes Maternal Grandmother     Social History   Socioeconomic History  . Marital status: Married    Spouse name: [name]   . Number of children: 1  . Years of education: 60  . Highest education level: Not on file  Occupational History  . Occupation: Research scientist (physical sciences): [employer]    Comment: [place]  Social Needs  . Financial resource strain: Not on file  . Food insecurity    Worry: Not on file    Inability: Not on file  . Transportation needs    Medical: Not on file    Non-medical: Not on file  Tobacco Use  . Smoking status: Never Smoker  . Smokeless tobacco: Never Used  Substance and Sexual Activity  . Alcohol use: Yes    Alcohol/week: 1.0 standard drinks    Types: 1 Glasses of wine per week  . Drug use: No  . Sexual activity: Yes    Partners: Male    Birth control/protection: None  Lifestyle  . Physical activity    Days per week: Not on file    Minutes per session: Not on file  . Stress: Not on file  Relationships  . Social Herbalist on phone: Not on file    Gets together:  Not on file    Attends religious service: Not on file    Active member of club or organization: Not on file    Attends meetings of clubs or organizations: Not on file    Relationship status: Not on file  . Intimate partner violence    Fear of current or ex partner: Not on file    Emotionally abused: Not on file    Physically abused: Not on file    Forced sexual activity: Not on file  Other Topics Concern  . Not  on file  Social History Narrative   Marital Status:  Married [name])   Children:  Daughter [name])    Pets: None    Living Situation: Lives with husband and daughter.   Occupation:  Engineer, [employer])    Education: Dietitian    Tobacco Use/Exposure:  None    Alcohol Use:  Occasional   Drug Use:  None   Diet:  Regular   Exercise:  Four times weekly    Hobbies: Reading                    Past Medical History, Surgical history, Social history, and Family history were reviewed and updated as appropriate.   Please see review of systems for further details on the patient's review from today.   Objective:   Physical Exam:  There were no vitals taken for this visit.  Physical Exam Neurological:     Mental Status: She is alert and oriented to person, place, and time.     Cranial Nerves: No dysarthria.  Psychiatric:        Attention and Perception: Attention normal.        Mood and Affect: Mood normal.        Speech: Speech normal.        Behavior: Behavior is cooperative.        Thought Content: Thought content normal. Thought content is not paranoid or delusional. Thought content does not include homicidal or suicidal ideation. Thought content does not include homicidal or suicidal plan.        Cognition and Memory: Cognition and memory normal.        Judgment: Judgment normal.     Comments: Insight good.     Lab Review:     Component Value Date/Time   NA 138 12/25/2010 0949   K 3.6 12/25/2010 0949   CL 102  12/25/2010 0949   CO2 24 12/25/2010 0949   GLUCOSE 76 12/25/2010 0949   BUN 42 (H) 12/25/2010 0949   CREATININE 4.34 (H) 12/25/2010 0949   CALCIUM 9.4 12/25/2010 0949   GFRNONAA 12 (L) 12/25/2010 0949   GFRAA 14 (L) 12/25/2010 0949       Component Value Date/Time   WBC 6.0 12/25/2010 0949   RBC 3.36 (L) 12/25/2010 0949   HGB 10.2 (L) 12/25/2010 0949   HCT 32.6 (L) 12/25/2010 0949   PLT 223 12/25/2010 0949   MCV 97.0 12/25/2010 0949   MCH 30.4 12/25/2010 0949   MCHC 31.3 12/25/2010 0949   RDW 12.9 12/25/2010 0949    No results found for: POCLITH, LITHIUM   No results found for: PHENYTOIN, PHENOBARB, VALPROATE, CBMZ   .res Assessment: Plan:    Meliha was seen today for follow-up and other.  Diagnoses and all orders for this visit:  Bipolar I disorder ([place]) -     divalproex (DEPAKOTE ER) 500 MG 24 hr tablet; Take 2 tablets (1,000 mg total) by mouth at bedtime.  Morbid obesity ([place])    Please see After Visit Summary for patient specific instructions.  Greater than 50% of 25 minutes of phone to phone time with patient was spent on counseling and coordination of care. We discussed the issues noted below  No further manic spells and no further use of weight loss supplements nor pills.  As noted she did have a manic episode last year when using a colon cleanse product but it resolved with discontinuation of that product.  Nephrologist doesn't want  her to weight loss pills either.  She understands that some of them can cause mania because we discussed it.  Answered her questions about gastric sleeve possibility and absorption of Depakote ER.  Should be OK as long as not doing gastric bypass which would be a problem.  No psychiatric  Contraindication for this procedure.  She's a good candidate for cooperation with the treatment plan for that procedure. No weight loss pills without talking to me bc some of manic risk.  No med change indicated.  Continue Depakote ER 1000 mg  daily.  Discussed side effect possibilities with Depakote including rash and liver toxicity as well as more common side effects such as balance problems and fatigue.  She is done well with that.  FU 1 year bc stable  Lynder Parents, MD, DFAPA   No future appointments.  No orders of the defined types were placed in this encounter.     -------------------------------

## 2018-12-12 ENCOUNTER — Other Ambulatory Visit: Payer: Self-pay | Admitting: Family Medicine

## 2018-12-20 ENCOUNTER — Ambulatory Visit
Admission: RE | Admit: 2018-12-20 | Discharge: 2018-12-20 | Disposition: A | Discharge status: Home / Self Care | Payer: BLUE CROSS/BLUE SHIELD | Source: Ambulatory Visit | Attending: Family Medicine | Admitting: Family Medicine

## 2018-12-30 ENCOUNTER — Telehealth: Payer: Self-pay | Admitting: Podiatry

## 2018-12-30 NOTE — Telephone Encounter (Signed)
Pt left message with questions about orthotics.. how long do they last etc...   I returned call and it rang 2 times and then went silent. No answer of voicemail available.

## 2019-01-20 DIAGNOSIS — Z86010 Personal history of colonic polyps: Secondary | ICD-10-CM | POA: Insufficient documentation

## 2019-03-01 ENCOUNTER — Other Ambulatory Visit: Payer: Self-pay

## 2019-03-01 DIAGNOSIS — I10 Essential (primary) hypertension: Secondary | ICD-10-CM

## 2019-03-01 MED ORDER — SPIRONOLACTONE 25 MG PO TABS: 25.0000 mg | ORAL_TABLET | Freq: Once | ORAL | 2 refills | Status: DC

## 2019-03-15 DIAGNOSIS — I10 Essential (primary) hypertension: Secondary | ICD-10-CM | POA: Insufficient documentation

## 2019-05-05 ENCOUNTER — Telehealth: Payer: Self-pay | Admitting: Psychiatry

## 2019-05-05 ENCOUNTER — Other Ambulatory Visit: Payer: Self-pay | Admitting: Psychiatry

## 2019-05-05 NOTE — Telephone Encounter (Signed)
Yes get vitamin B12 and D levels checked.

## 2019-05-05 NOTE — Telephone Encounter (Signed)
Patient had gastric sleeve put in on2/9 and she wants to know if you want any labs done since she had that done and there is a change in her health. Please give her a call back at [phone]

## 2019-05-08 ENCOUNTER — Other Ambulatory Visit: Payer: Self-pay | Admitting: Psychiatry

## 2019-05-08 DIAGNOSIS — K9189 Other postprocedural complications and disorders of digestive system: Secondary | ICD-10-CM

## 2019-05-08 DIAGNOSIS — Y832 Surgical operation with anastomosis, bypass or graft as the cause of abnormal reaction of the patient, or of later complication, without mention of misadventure at the time of the procedure: Secondary | ICD-10-CM

## 2019-05-08 DIAGNOSIS — R7989 Other specified abnormal findings of blood chemistry: Secondary | ICD-10-CM

## 2019-05-08 NOTE — Telephone Encounter (Signed)
Ordered B12 and folate.

## 2019-05-08 NOTE — Telephone Encounter (Signed)
Can you please send orders to [facility] per pt and she will get them done there.

## 2019-05-10 ENCOUNTER — Telehealth: Payer: Self-pay | Admitting: Psychiatry

## 2019-05-10 NOTE — Telephone Encounter (Signed)
error 

## 2019-05-12 LAB — VITAMIN D 1,25 DIHYDROXY
Vitamin D 1, 25 (OH)2 Total: 63 pg/mL (ref 18–72)
Vitamin D2 1, 25 (OH)2: <8 pg/mL
Vitamin D3 1, 25 (OH)2: 63 pg/mL

## 2019-05-12 LAB — VITAMIN B12: Vitamin B-12: 1782 pg/mL — ABNORMAL HIGH (ref 200–1100)

## 2019-05-27 ENCOUNTER — Ambulatory Visit: Payer: Self-pay | Attending: Internal Medicine

## 2019-05-27 DIAGNOSIS — Z23 Encounter for immunization: Secondary | ICD-10-CM | POA: Insufficient documentation

## 2019-05-27 NOTE — Progress Notes (Signed)
   Covid-19 Vaccination Clinic  Name:  Carolyn Chang    MRN: [id number] DOB: 06-Nov-1970  05/27/2019  [name] was observed post Covid-19 immunization for 15 minutes without incident. She was provided with Vaccine Information Sheet and instruction to access the V-Safe system.   [name] was instructed to call 911 with any severe reactions post vaccine: Marland Kitchen Difficulty breathing  . Swelling of face and throat  . A fast heartbeat  . A bad rash all over body  . Dizziness and weakness   Immunizations Administered    Name Date Dose VIS Date Route   Pfizer COVID-19 Vaccine 05/27/2019  9:06 AM 0.3 mL 03/03/2019 Intramuscular   Manufacturer: [place]   Lot: [id number]   [place]: [id number]

## 2019-06-17 ENCOUNTER — Ambulatory Visit: Payer: Self-pay | Attending: Internal Medicine

## 2019-06-17 DIAGNOSIS — Z23 Encounter for immunization: Secondary | ICD-10-CM

## 2019-06-17 NOTE — Progress Notes (Signed)
   Covid-19 Vaccination Clinic  Name:  Carolyn Chang    MRN: [id number] DOB: [birth year]  06/17/2019  [name] was observed post Covid-19 immunization for 15 minutes without incident. She was provided with Vaccine Information Sheet and instruction to access the V-Safe system.   [name] was instructed to call 911 with any severe reactions post vaccine: Marland Kitchen Difficulty breathing  . Swelling of face and throat  . A fast heartbeat  . A bad rash all over body  . Dizziness and weakness   Immunizations Administered    Name Date Dose VIS Date Route   Pfizer COVID-19 Vaccine 06/17/2019  9:26 AM 0.3 mL 03/03/2019 Intramuscular   Manufacturer: [place]   Lot: [id number]   [place]: [id number]

## 2019-12-03 ENCOUNTER — Other Ambulatory Visit: Payer: Self-pay | Admitting: Cardiology

## 2019-12-03 DIAGNOSIS — I10 Essential (primary) hypertension: Secondary | ICD-10-CM

## 2019-12-04 NOTE — Telephone Encounter (Signed)
Is this refill appropriate?  

## 2019-12-22 ENCOUNTER — Other Ambulatory Visit: Payer: Self-pay | Admitting: Psychiatry

## 2019-12-22 DIAGNOSIS — F319 Bipolar disorder, unspecified: Secondary | ICD-10-CM

## 2019-12-22 NOTE — Telephone Encounter (Signed)
Please review

## 2020-03-05 ENCOUNTER — Ambulatory Visit (INDEPENDENT_AMBULATORY_CARE_PROVIDER_SITE_OTHER): Payer: BC Managed Care – PPO | Admitting: Psychiatry

## 2020-03-05 ENCOUNTER — Encounter: Payer: Self-pay | Admitting: Psychiatry

## 2020-03-05 ENCOUNTER — Other Ambulatory Visit: Payer: Self-pay

## 2020-03-05 DIAGNOSIS — F319 Bipolar disorder, unspecified: Secondary | ICD-10-CM | POA: Diagnosis not present

## 2020-03-05 DIAGNOSIS — R7989 Other specified abnormal findings of blood chemistry: Secondary | ICD-10-CM | POA: Diagnosis not present

## 2020-03-05 MED ORDER — DIVALPROEX SODIUM ER 500 MG PO TB24: 1000.0000 mg | ORAL_TABLET | Freq: Every day | ORAL | 3 refills | Status: DC

## 2020-03-05 NOTE — Progress Notes (Signed)
Carolyn Chang [id number] [birth year] 49 y.o.   Subjective:   Patient ID:  Carolyn Chang is a 49 y.o. (DOB 1970-07-07) female.  Chief Complaint:  Chief Complaint  Patient presents with  . Follow-up    HPI Carolyn Chang presents for follow-up of bipolar disorder.  seen August 2019.  Carolyn Chang was stable at the time and no meds were changed.  Carolyn Chang had had an episode of mania after a colon cleanse but that resolved early 2019.  Otherwise stable for years.  03/05/20 appt with following noted: Bored working from home.   Vaccinated.   H not ready to go out yet. Had Gastric sleeve Feb 2021 and lost from 254# to 194#.  No problems with Depakote ER 1000 mg HS and is consistent.  Doing well.  No Covid.  Working from home and going [place].  Managing and tries to walk in the morning and walks.  Patient reports stable mood and denies depressed or irritable moods.  No mania.  Patient denies any recent difficulty with anxiety.  Patient denies difficulty with sleep initiation or maintenance. Denies appetite disturbance.  Patient reports that energy and motivation have been good.  Patient denies any difficulty with concentration.  Patient denies any suicidal ideation.  Past Psychiatric Medication Trials: Depakote ER 1000 mg since under our care July 2003. Haldol side effects no history of antidepressants.   Diagnosed bipolar disorder 1995. History lithium toxicity  Review of Systems:  Review of Systems  Gastrointestinal: Negative for nausea.  Neurological: Negative for tremors and weakness.    Medications: I have reviewed the patient's current medications.  Current Outpatient Medications  Medication Sig Dispense Refill  . amLODipine (NORVASC) 5 MG tablet Take 1 tablet (5 mg total) by mouth daily. 90 tablet 3  . aspirin 81 MG tablet Take 81 mg by mouth daily.    Marland Kitchen atorvastatin (LIPITOR) 10 MG tablet Take 10 mg by mouth daily.  3  . b complex vitamins tablet Take 1 tablet by mouth daily.    .  Calcium Acetate, Phos Binder, (CALCIUM ACETATE PO) Take by mouth.    . cycloSPORINE (RESTASIS) 0.05 % ophthalmic emulsion 1 drop 2 (two) times daily.    . ferrous sulfate 325 (65 FE) MG tablet Take 325 mg by mouth daily with breakfast.    . influenza vaccine (FLUCELVAX QUADRIVALENT) 0.5 ML injection Flucelvax Quad 2019-2020 (PF) 60 mcg (15 mcg x 4)/0.5 mL IM syringe  TO BE ADMINISTERED BY PHARMACIST FOR IMMUNIZATION    . labetalol (NORMODYNE) 100 MG tablet Take 200 mg by mouth 2 (two) times daily.    Marland Kitchen losartan (COZAAR) 25 MG tablet Take 50 mg by mouth daily.     . Magnesium 400 MG CAPS Take 1 capsule by mouth. Reported on 04/06/2015    . Multiple Vitamin (MULTIVITAMIN) capsule Take 1 capsule by mouth daily.    . mupirocin ointment (BACTROBAN) 2 %     . mycophenolate (CELLCEPT) 500 MG tablet   3  . sulfamethoxazole-trimethoprim (BACTRIM) 400-80 MG tablet sulfamethoxazole 400 mg-trimethoprim 80 mg tablet  TAKE 1 TABLET BY MOUTH EVERY MONDAY, WEDNESDAY, AND FRIDAY    . tacrolimus (PROGRAF) 1 MG capsule Take 1 mg by mouth 2 (two) times daily.    Marland Kitchen torsemide (DEMADEX) 20 MG tablet Take 20 mg by mouth every other day.  3  . Zinc Acetate, Oral, (ZINC ACETATE PO) Take by mouth.    . divalproex (DEPAKOTE ER) 500 MG 24 hr tablet Take 2 tablets (1,000 mg  total) by mouth at bedtime. 180 tablet 3  . spironolactone (ALDACTONE) 25 MG tablet Take 1 tablet (25 mg total) by mouth once for 1 dose. 90 tablet 2   No current facility-administered medications for this visit.    Medication Side Effects: None  Allergies: No Known Allergies  Past Medical History:  Diagnosis Date  . Bipolar 2 disorder ([place])   . Chronic kidney disease    Seconday to Lithium Treatment   . Hyperlipidemia   . Hypertension   . Obesity   . Sleep apnea    CPAP    Family History  Problem Relation Age of Onset  . Arthritis Father   . Cirrhosis Father   . Lung disease Sister   . Diabetes Maternal Grandmother     Social  History   Socioeconomic History  . Carolyn status: Married    Spouse name: Karrie Chang   . Number of children: 1  . Years of education: 14  . Highest education level: Not on file  Occupational History  . Occupation: Research scientist (physical sciences): [employer]    Comment: [employer]  Tobacco Use  . Smoking status: Never Smoker  . Smokeless tobacco: Never Used  Substance and Sexual Activity  . Alcohol use: Yes    Alcohol/week: 1.0 standard drink    Types: 1 Glasses of wine per week  . Drug use: No  . Sexual activity: Yes    Partners: Male    Birth control/protection: None  Other Topics Concern  . Not on file  Social History Narrative   Carolyn Status:  Married Karrie Chang)   Children:  Daughter [name])    Pets: None    Living Situation: Lives with husband and daughter.   Occupation:  Engineer, [employer])    Education: Dietitian    Tobacco Use/Exposure:  None    Alcohol Use:  Occasional   Drug Use:  None   Diet:  Regular   Exercise:  Four times weekly    Hobbies: Reading                   Social Determinants of Health   Financial Resource Strain: Not on file  Food Insecurity: Not on file  Transportation Needs: Not on file  Physical Activity: Not on file  Stress: Not on file  Social Connections: Not on file  Intimate Partner Violence: Not on file    Past Medical History, Surgical history, Social history, and Family history were reviewed and updated as appropriate.   Please see review of systems for further details on the patient's review from today.   Objective:   Physical Exam:  There were no vitals taken for this visit.  Physical Exam Neurological:     Mental Status: Carolyn Chang is alert and oriented to person, place, and time.     Cranial Nerves: No dysarthria.  Psychiatric:        Attention and Perception: Attention normal.        Mood and Affect: Mood normal. Mood is not anxious or depressed.         Speech: Speech normal.        Behavior: Behavior is cooperative.        Thought Content: Thought content normal. Thought content is not paranoid or delusional. Thought content does not include homicidal or suicidal ideation. Thought content does not include homicidal or suicidal plan.        Cognition and Memory: Cognition and  memory normal.        Judgment: Judgment normal.     Comments: Insight good.     Lab Review:     Component Value Date/Time   NA 138 12/25/2010 0949   K 3.6 12/25/2010 0949   CL 102 12/25/2010 0949   CO2 24 12/25/2010 0949   GLUCOSE 76 12/25/2010 0949   BUN 42 (H) 12/25/2010 0949   CREATININE 4.34 (H) 12/25/2010 0949   CALCIUM 9.4 12/25/2010 0949   GFRNONAA 12 (L) 12/25/2010 0949   GFRAA 14 (L) 12/25/2010 0949       Component Value Date/Time   WBC 6.0 12/25/2010 0949   RBC 3.36 (L) 12/25/2010 0949   HGB 10.2 (L) 12/25/2010 0949   HCT 32.6 (L) 12/25/2010 0949   PLT 223 12/25/2010 0949   MCV 97.0 12/25/2010 0949   MCH 30.4 12/25/2010 0949   MCHC 31.3 12/25/2010 0949   RDW 12.9 12/25/2010 0949    No results found for: POCLITH, LITHIUM   No results found for: PHENYTOIN, PHENOBARB, VALPROATE, CBMZ   .res Assessment: Plan:    Illyanna was seen today for follow-up.  Diagnoses and all orders for this visit:  Bipolar I disorder ([place]) -     divalproex (DEPAKOTE ER) 500 MG 24 hr tablet; Take 2 tablets (1,000 mg total) by mouth at bedtime.  Low vitamin D level    Please see After Visit Summary for patient specific instructions.  Greater than 50% of 25 minutes of phone to phone time with patient was spent on counseling and coordination of care. We discussed the issues noted below  No further manic spells and no further use of weight loss supplements nor pills.  As noted Carolyn Chang did have a manic episode last year when using a colon cleanse product but it resolved with discontinuation of that product.  Nephrologist doesn't want her to weight loss  pills either.  Carolyn Chang understands that some of them can cause mania because we discussed it.  Had gastric sleeve possibility and absorption of Depakote ER.  Should be OK as long as not doing gastric bypass which would be a problem.  No psychiatric  Contraindication for this procedure and Carolyn Chang's lost 71#.  No med change indicated.  Continue Depakote ER 1000 mg daily.  Discussed side effect possibilities with Depakote including rash and liver toxicity as well as more common side effects such as balance problems and fatigue.  Carolyn Chang is done well with that.  Consider checking level but stability suggests no change indicated.   FU 1 year bc stable  Lynder Parents, MD, DFAPA   No future appointments.  No orders of the defined types were placed in this encounter.     -------------------------------

## 2020-04-11 DIAGNOSIS — D259 Leiomyoma of uterus, unspecified: Secondary | ICD-10-CM | POA: Insufficient documentation

## 2020-04-11 DIAGNOSIS — Z9884 Bariatric surgery status: Secondary | ICD-10-CM | POA: Insufficient documentation

## 2020-04-11 LAB — HM PAP SMEAR

## 2020-05-08 ENCOUNTER — Other Ambulatory Visit: Payer: Self-pay | Admitting: Obstetrics and Gynecology

## 2020-05-08 DIAGNOSIS — D259 Leiomyoma of uterus, unspecified: Secondary | ICD-10-CM

## 2020-05-22 ENCOUNTER — Ambulatory Visit
Admission: RE | Admit: 2020-05-22 | Discharge: 2020-05-22 | Disposition: A | Discharge status: Home / Self Care | Payer: BC Managed Care – PPO | Source: Ambulatory Visit | Attending: Obstetrics and Gynecology | Admitting: Obstetrics and Gynecology

## 2020-05-22 ENCOUNTER — Other Ambulatory Visit: Payer: Self-pay

## 2020-05-22 DIAGNOSIS — D259 Leiomyoma of uterus, unspecified: Secondary | ICD-10-CM

## 2020-05-22 MED ORDER — IOPAMIDOL (ISOVUE-300) INJECTION 61%
100.0000 mL | Freq: Once | INTRAVENOUS | Status: AC | PRN
  Administered 2020-05-22: 100 mL via INTRAVENOUS

## 2021-01-23 ENCOUNTER — Telehealth: Payer: Self-pay | Admitting: Podiatry

## 2021-01-23 ENCOUNTER — Ambulatory Visit (INDEPENDENT_AMBULATORY_CARE_PROVIDER_SITE_OTHER): Payer: BC Managed Care – PPO | Admitting: Podiatry

## 2021-01-23 ENCOUNTER — Encounter: Payer: Self-pay | Admitting: Podiatry

## 2021-01-23 ENCOUNTER — Other Ambulatory Visit: Payer: Self-pay

## 2021-01-23 DIAGNOSIS — M722 Plantar fascial fibromatosis: Secondary | ICD-10-CM

## 2021-01-23 NOTE — Progress Notes (Signed)
She presents today for follow-up of her [place] fasciitis pes planus and posterior tibial tendinitis states that her orthotics are doing really well she enjoys them states that they work well and have absolutely no foot problems unless she does not wear them.  States that she thinks that they are starting to wear out and would like to see about getting some new orthotics.  Objective: Vital signs are stable she is alert and oriented x3 pulses are palpable she has severe pes planus bilateral no fluctuance on palpation of the posterior tibial tendon she does not have any pain on palpation of the posterior tibial tendon.  She has no pain on palpation of the heels bilaterally.  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  Plan: New orthotics are necessary to maintain her feet from hurting.  I will follow-up with her once those new ones come in today.  She was casted today for them.

## 2021-01-23 NOTE — Telephone Encounter (Signed)
Pt called insurance company but was not able to get a clear answer as to if they would cover 2 pairs of orthotics. She stated she has met her deductible so they should cover them at 100%. She was asking if we could bill for 2 pair and only order one pair until we see if the insurance will cover the 2nd pair. She did not want to do the 219.00 self pay for the 2nd one I offered.

## 2021-02-25 ENCOUNTER — Telehealth: Payer: Self-pay | Admitting: Podiatry

## 2021-02-25 DIAGNOSIS — M722 Plantar fascial fibromatosis: Secondary | ICD-10-CM

## 2021-02-25 NOTE — Telephone Encounter (Signed)
Called back and left message that the vendor said they should be shipping by end of this week so I should get them next week and I will call when they come in.

## 2021-02-25 NOTE — Telephone Encounter (Signed)
Pt left message checking on status of her orthotics.  I returned call and left a message stating we have not gotten them in yet. It is not our current vendor but I have emailed them to see if I could get an update. I told pt I would call back when I hear back from vendor.

## 2021-03-03 ENCOUNTER — Telehealth: Payer: Self-pay | Admitting: Podiatry

## 2021-03-03 NOTE — Telephone Encounter (Signed)
Orthotics in.. lvm for pt to call to discuss if she feels she needs an appt or just wants to pick them up since she has had them before.

## 2021-03-05 ENCOUNTER — Ambulatory Visit (INDEPENDENT_AMBULATORY_CARE_PROVIDER_SITE_OTHER): Payer: BC Managed Care – PPO | Admitting: Psychiatry

## 2021-03-05 ENCOUNTER — Other Ambulatory Visit: Payer: Self-pay

## 2021-03-05 ENCOUNTER — Encounter: Payer: Self-pay | Admitting: Psychiatry

## 2021-03-05 ENCOUNTER — Ambulatory Visit: Payer: BC Managed Care – PPO

## 2021-03-05 DIAGNOSIS — K9189 Other postprocedural complications and disorders of digestive system: Secondary | ICD-10-CM

## 2021-03-05 DIAGNOSIS — F319 Bipolar disorder, unspecified: Secondary | ICD-10-CM

## 2021-03-05 DIAGNOSIS — Y832 Surgical operation with anastomosis, bypass or graft as the cause of abnormal reaction of the patient, or of later complication, without mention of misadventure at the time of the procedure: Secondary | ICD-10-CM | POA: Diagnosis not present

## 2021-03-05 DIAGNOSIS — R7989 Other specified abnormal findings of blood chemistry: Secondary | ICD-10-CM | POA: Diagnosis not present

## 2021-03-05 DIAGNOSIS — M722 Plantar fascial fibromatosis: Secondary | ICD-10-CM

## 2021-03-05 MED ORDER — DIVALPROEX SODIUM ER 500 MG PO TB24: 1000.0000 mg | ORAL_TABLET | Freq: Every day | ORAL | 3 refills | Status: DC

## 2021-03-05 NOTE — Progress Notes (Signed)
SITUATION: Reason for Visit: Fitting and Delivery of Custom Fabricated Foot Orthoses Patient Report: Patient reports comfort and is satisfied with device.  OBJECTIVE DATA: Patient History / Diagnosis:  No change in pathology Provided Device:  Custom functional foot orthoses  GOAL OF ORTHOSIS - Improve gait - Decrease energy expenditure - Improve Balance - Provide Triplanar stability of foot complex - Facilitate motion  ACTIONS PERFORMED Patient was fit with foot orthoses trimmed to shoe last. Patient tolerated fittign procedure. Device was modified as follows to better fit patient: - Toe plate was trimmed to shoe last  Patient was provided with verbal and written instruction and demonstration regarding donning, doffing, wear, care, proper fit, function, purpose, cleaning, and use of the orthosis and in all related precautions and risks and benefits regarding the orthosis.  Patient was also provided with verbal instruction regarding how to report any failures or malfunctions of the orthosis and necessary follow up care. Patient was also instructed to contact our office regarding any change in status that may affect the function of the orthosis.  Patient demonstrated independence with proper donning, doffing, and fit and verbalized understanding of all instructions.  PLAN: Patient is to follow up in one week or as necessary (PRN). All questions were answered and concerns addressed. Plan of care was discussed with and agreed upon by the patient.

## 2021-03-05 NOTE — Progress Notes (Signed)
Carolyn Chang [id number] [birth year] 50 y.o.   Subjective:   Patient ID:  Carolyn Chang is a 50 y.o. (DOB [birth year]) female.  Chief Complaint:  Chief Complaint  Patient presents with   Follow-up    Bipolar I disorder ([place])    HPI Carolyn Chang presents for follow-up of bipolar disorder.  seen August 2019.  She was stable at the time and no meds were changed.  She had had an episode of mania after a colon cleanse but that resolved early 2019.  Otherwise stable for years.  03/05/20 appt with following noted: Bored working from home.   Vaccinated.   H not ready to go out yet. Had Gastric sleeve Feb 2021 and lost from 254# to 194#. No problems with Depakote ER 1000 mg HS and is consistent.  03/05/21 appt noted: Only psych med is Depakote ER 1000 mg nightly and is consistent with it. No side effect Work from home but got Covid once in July after travel.  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  Doing well.  No Covid.  Working from home and going [place].  Managing and tries to walk in the morning and walks.  Patient reports stable mood and denies depressed or irritable moods.  No mania.  Patient denies any recent difficulty with anxiety.  Patient denies difficulty with sleep initiation or maintenance 5-7 hours. Sometimes to the gym. Denies appetite disturbance.  Patient reports that energy and motivation have been good.  Patient denies any difficulty with concentration.  Patient denies any suicidal ideation.  Past Psychiatric Medication Trials: Depakote ER 1000 mg since under our care July 2003. Haldol side effects no history of antidepressants.   Diagnosed bipolar disorder 1995. History lithium toxicity  Review of Systems:  Review of Systems  Gastrointestinal:  Negative for abdominal pain and nausea.  Neurological:  Negative for tremors and weakness.   Medications: I have reviewed the patient's current medications.  Current Outpatient Medications  Medication Sig Dispense  Refill   amLODipine (NORVASC) 5 MG tablet Take 1 tablet (5 mg total) by mouth daily. 90 tablet 3   aspirin 81 MG tablet Take 81 mg by mouth daily.     atorvastatin (LIPITOR) 10 MG tablet Take 10 mg by mouth daily.  3   b complex vitamins tablet Take 1 tablet by mouth daily.     Calcium Acetate, Phos Binder, (CALCIUM ACETATE PO) Take by mouth.     cycloSPORINE (RESTASIS) 0.05 % ophthalmic emulsion 1 drop 2 (two) times daily.     ferrous sulfate 325 (65 FE) MG tablet Take 325 mg by mouth daily with breakfast.     influenza vaccine (FLUCELVAX QUADRIVALENT) 0.5 ML injection Flucelvax Quad 2019-2020 (PF) 60 mcg (15 mcg x 4)/0.5 mL IM syringe  TO BE ADMINISTERED BY PHARMACIST FOR IMMUNIZATION     labetalol (NORMODYNE) 100 MG tablet Take 200 mg by mouth 2 (two) times daily.     losartan (COZAAR) 25 MG tablet Take 50 mg by mouth daily.      Magnesium 400 MG CAPS Take 1 capsule by mouth. Reported on 04/06/2015     montelukast (SINGULAIR) 10 MG tablet Take 10 mg by mouth at bedtime.     Multiple Vitamin (MULTIVITAMIN) capsule Take 1 capsule by mouth daily.     mupirocin ointment (BACTROBAN) 2 %      MYFORTIC 180 MG EC tablet Take by mouth.     omeprazole (PRILOSEC) 20 MG capsule Take 20 mg by mouth daily.  sulfamethoxazole-trimethoprim (BACTRIM) 400-80 MG tablet sulfamethoxazole 400 mg-trimethoprim 80 mg tablet  TAKE 1 TABLET BY MOUTH EVERY MONDAY, WEDNESDAY, AND FRIDAY     tacrolimus (PROGRAF) 1 MG capsule Take 1 mg by mouth 2 (two) times daily.     torsemide (DEMADEX) 20 MG tablet Take 20 mg by mouth every other day.  3   Zinc Acetate, Oral, (ZINC ACETATE PO) Take by mouth.     divalproex (DEPAKOTE ER) 500 MG 24 hr tablet Take 2 tablets (1,000 mg total) by mouth at bedtime. 180 tablet 3   mycophenolate (CELLCEPT) 500 MG tablet  (Patient not taking: Reported on 03/05/2021)  3   spironolactone (ALDACTONE) 25 MG tablet Take 1 tablet (25 mg total) by mouth once for 1 dose. 90 tablet 2   No current  facility-administered medications for this visit.    Medication Side Effects: None  Allergies: No Known Allergies  Past Medical History:  Diagnosis Date   Bipolar 2 disorder ([place])    Chronic kidney disease    Seconday to Lithium Treatment    Hyperlipidemia    Hypertension    Obesity    Sleep apnea    CPAP    Family History  Problem Relation Age of Onset   Arthritis Father    Cirrhosis Father    Lung disease Sister    Diabetes Maternal Grandmother     Social History   Socioeconomic History   Marital status: Married    Spouse name: [name]    Number of children: 1   Years of education: 16   Highest education level: Not on file  Occupational History   Occupation: CUSTOMER SERVICE    Employer: [employer]    Comment: [employer]  Tobacco Use   Smoking status: Never   Smokeless tobacco: Never  Substance and Sexual Activity   Alcohol use: Yes    Alcohol/week: 1.0 standard drink    Types: 1 Glasses of wine per week   Drug use: No   Sexual activity: Yes    Partners: Male    Birth control/protection: None  Other Topics Concern   Not on file  Social History Narrative   Marital Status:  Married [name])   Children:  Daughter [name])    Pets: None    Living Situation: Lives with husband and daughter.   Occupation:  Engineer, [employer])    Education: Dietitian    Tobacco Use/Exposure:  None    Alcohol Use:  Occasional   Drug Use:  None   Diet:  Regular   Exercise:  Four times weekly    Hobbies: Reading                   Social Determinants of Health   Financial Resource Strain: Not on file  Food Insecurity: Not on file  Transportation Needs: Not on file  Physical Activity: Not on file  Stress: Not on file  Social Connections: Not on file  Intimate Partner Violence: Not on file    Past Medical History, Surgical history, Social history, and Family history were reviewed and updated  as appropriate.   Please see review of systems for further details on the patient's review from today.   Objective:   Physical Exam:  There were no vitals taken for this visit.  Physical Exam Constitutional:      General: She is not in acute distress. Musculoskeletal:        General: No deformity.  Neurological:  Mental Status: She is alert and oriented to person, place, and time.     Cranial Nerves: No dysarthria.     Coordination: Coordination normal.  Psychiatric:        Attention and Perception: Attention and perception normal. She does not perceive auditory or visual hallucinations.        Mood and Affect: Mood normal. Mood is not anxious or depressed. Affect is not labile, blunt, angry or inappropriate.        Speech: Speech normal.        Behavior: Behavior normal. Behavior is cooperative.        Thought Content: Thought content normal. Thought content is not paranoid or delusional. Thought content does not include homicidal or suicidal ideation.        Cognition and Memory: Cognition and memory normal.        Judgment: Judgment normal.     Comments: Insight good.    Lab Review:     Component Value Date/Time   NA 138 12/25/2010 0949   K 3.6 12/25/2010 0949   CL 102 12/25/2010 0949   CO2 24 12/25/2010 0949   GLUCOSE 76 12/25/2010 0949   BUN 42 (H) 12/25/2010 0949   CREATININE 4.34 (H) 12/25/2010 0949   CALCIUM 9.4 12/25/2010 0949   GFRNONAA 12 (L) 12/25/2010 0949   GFRAA 14 (L) 12/25/2010 0949       Component Value Date/Time   WBC 6.0 12/25/2010 0949   RBC 3.36 (L) 12/25/2010 0949   HGB 10.2 (L) 12/25/2010 0949   HCT 32.6 (L) 12/25/2010 0949   PLT 223 12/25/2010 0949   MCV 97.0 12/25/2010 0949   MCH 30.4 12/25/2010 0949   MCHC 31.3 12/25/2010 0949   RDW 12.9 12/25/2010 0949    No results found for: POCLITH, LITHIUM   No results found for: PHENYTOIN, PHENOBARB, VALPROATE, CBMZ   .res Assessment: Plan:    Evanthia was seen today for  follow-up.  Diagnoses and all orders for this visit:  Bipolar I disorder ([place]) -     divalproex (DEPAKOTE ER) 500 MG 24 hr tablet; Take 2 tablets (1,000 mg total) by mouth at bedtime.  Low vitamin D level  Complications of gastric bypass surgery   Please see After Visit Summary for patient specific instructions.  We discussed the issues noted below  No further manic spells and no further use of weight loss supplements nor pills.  As noted she did have a manic episode last year when using a colon cleanse product but it resolved with discontinuation of that product.  Nephrologist doesn't want her to weight loss pills either.  She understands that some of them can cause mania because we discussed it.  Had gastric sleeve possibility and absorption of Depakote ER.  Should be OK as long as not doing gastric bypass which would be a problem.  No psychiatric  Contraindication for this procedure and she's lost 65#.  No med change indicated.  Continue Depakote ER 1000 mg daily as the only psychiatric medication.  Discussed side effect possibilities with Depakote including rash and liver toxicity as well as more common side effects such as balance problems and fatigue.  She is done well with that.  Consider checking level but stability suggests no change indicated.   FU 1 year bc stable  Lynder Parents, MD, DFAPA   No future appointments.  No orders of the defined types were placed in this encounter.     -------------------------------

## 2021-08-21 LAB — HM MAMMOGRAPHY

## 2022-01-19 ENCOUNTER — Encounter: Payer: Self-pay | Admitting: Nurse Practitioner

## 2022-01-19 ENCOUNTER — Ambulatory Visit (INDEPENDENT_AMBULATORY_CARE_PROVIDER_SITE_OTHER): Payer: BC Managed Care – PPO | Admitting: Nurse Practitioner

## 2022-01-19 VITALS — BP 136/82 | HR 75 | Temp 97.2°F | Ht 63.0 in | Wt 201.6 lb

## 2022-01-19 DIAGNOSIS — R600 Localized edema: Secondary | ICD-10-CM

## 2022-01-19 DIAGNOSIS — G4733 Obstructive sleep apnea (adult) (pediatric): Secondary | ICD-10-CM

## 2022-01-19 DIAGNOSIS — Z23 Encounter for immunization: Secondary | ICD-10-CM

## 2022-01-19 DIAGNOSIS — F3176 Bipolar disorder, in full remission, most recent episode depressed: Secondary | ICD-10-CM

## 2022-01-19 DIAGNOSIS — I150 Renovascular hypertension: Secondary | ICD-10-CM

## 2022-01-19 DIAGNOSIS — Z6835 Body mass index (BMI) 35.0-35.9, adult: Secondary | ICD-10-CM

## 2022-01-19 DIAGNOSIS — E782 Mixed hyperlipidemia: Secondary | ICD-10-CM | POA: Insufficient documentation

## 2022-01-19 DIAGNOSIS — D849 Immunodeficiency, unspecified: Secondary | ICD-10-CM

## 2022-01-19 MED ORDER — TORSEMIDE 20 MG PO TABS: 20.0000 mg | ORAL_TABLET | Freq: Every day | ORAL | 0 refills | Status: DC | PRN

## 2022-01-19 MED ORDER — ATORVASTATIN CALCIUM 10 MG PO TABS: 10.0000 mg | ORAL_TABLET | Freq: Every day | ORAL | 3 refills | Status: DC

## 2022-01-19 NOTE — Progress Notes (Signed)
Established Patient Visit  Patient: Carolyn Chang   DOB: [birth year]   51 y.o. Female  MRN: [id number] Visit Date: 01/21/2022  Subjective:    Chief Complaint  Patient presents with   Establish Care    New Pt, Est  Care  Torsemide refill  No other concerns Tdap given today    HPI Previous pcp: Dr. Dr. Elza Rafter. Nephrology: Dr. Dimas Aguas [facility] GYN: [facility]: Dr. Gertie Fey, uptodate with mammogrm and PAP Bariatric clinic: Dr. Kathleen Lime Psychiatrist: Dr. Clovis Pu, depakote for bipolar depression Last colonoscopy 2021: polyps removed-benign  OSA (obstructive sleep apnea) Reports compliance with CPAP machine but needs to replace tubes and mask.  Last sleep study ordered by nephrology: report requested. Entered referral to sleep clinic and sent order for CPAP supplies.  Renovascular hypertension Managed by Dr. Liam Graham (nephrology). Admits to not taking amlodipine and losartan Only takes labetalol BID at this time S/p kidney transplant x1, use of prograf and myfortic Managed by [facility] Reviewed labs results on patient's mobile device: CBC, CMP, and vit. D BP Readings from Last 3 Encounters:  01/19/22 136/82  09/16/17 116/68  04/06/15 112/78   Continue f/up with nephrology and [facility]  Bipolar disorder [facility]) Current use of depakote. Stable mood Managed by Dr. Clovis Pu Denies need for psychology appts.  Obesity S/p gastric sleeve 2021 Lowest weight 190lbs Heaveiest weight 265lbs Exercise: 3-4x/week: weight training and cardio Diet: follows diet provided by nutritionist Hx of chronic GERD: symptoms controlled with omeprazole. We discussed use of GLP-1 injection as an additional weight loss tool and its possible side effects. I answered all her questions to her satisfaction. She plans to f/up with [facility] to get wegovy rx Wt Readings from Last 3 Encounters:   01/19/22 201 lb 9.6 oz (91.4 kg)  04/06/15 227 lb 12.8 oz (103.3 kg)  05/05/13 243 lb (110.2 kg)    Bilateral leg edema Chronic, waxing and waning, no SOB or PND or CP No hx of CHF Use of torsemide prn No Le edema present at this time. Med refill sent.  Reviewed medical, surgical, and social history today  Medications: Outpatient Medications Prior to Visit  Medication Sig   aspirin 81 MG tablet Take 81 mg by mouth daily.   Calcium Acetate, Phos Binder, (CALCIUM ACETATE PO) Take by mouth.   divalproex (DEPAKOTE ER) 500 MG 24 hr tablet Take 2 tablets (1,000 mg total) by mouth at bedtime.   ferrous sulfate 325 (65 FE) MG tablet Take 325 mg by mouth daily with breakfast.   influenza vaccine (FLUCELVAX QUADRIVALENT) 0.5 ML injection Flucelvax Quad 2019-2020 (PF) 60 mcg (15 mcg x 4)/0.5 mL IM syringe  TO BE ADMINISTERED BY PHARMACIST FOR IMMUNIZATION   labetalol (NORMODYNE) 100 MG tablet Take 200 mg by mouth 2 (two) times daily.   Multiple Vitamin (MULTIVITAMIN) capsule Take 1 capsule by mouth daily.   mupirocin ointment (BACTROBAN) 2 %    MYFORTIC 180 MG EC tablet Take by mouth.   omeprazole (PRILOSEC) 20 MG capsule Take 20 mg by mouth daily.   tacrolimus (PROGRAF) 1 MG capsule Take 1 mg by mouth 2 (two) times daily.   Zinc Acetate, Oral, (ZINC ACETATE PO) Take by mouth.   [DISCONTINUED] amLODipine (NORVASC) 5 MG tablet Take 1 tablet (5 mg total) by mouth daily.   [DISCONTINUED] atorvastatin (LIPITOR) 10 MG tablet Take 10 mg by mouth  daily.   [DISCONTINUED] losartan (COZAAR) 25 MG tablet Take 50 mg by mouth daily.    [DISCONTINUED] torsemide (DEMADEX) 20 MG tablet Take 20 mg by mouth every other day.   Magnesium 400 MG CAPS Take 1 capsule by mouth. Reported on 04/06/2015   [DISCONTINUED] b complex vitamins tablet Take 1 tablet by mouth daily. (Patient not taking: Reported on 01/19/2022)   [DISCONTINUED] cycloSPORINE (RESTASIS) 0.05 % ophthalmic emulsion 1 drop 2 (two) times daily.    [DISCONTINUED] montelukast (SINGULAIR) 10 MG tablet Take 10 mg by mouth at bedtime.   [DISCONTINUED] mycophenolate (CELLCEPT) 500 MG tablet  (Patient not taking: Reported on 03/05/2021)   [DISCONTINUED] spironolactone (ALDACTONE) 25 MG tablet Take 1 tablet (25 mg total) by mouth once for 1 dose.   [DISCONTINUED] sulfamethoxazole-trimethoprim (BACTRIM) 400-80 MG tablet sulfamethoxazole 400 mg-trimethoprim 80 mg tablet  TAKE 1 TABLET BY MOUTH EVERY MONDAY, WEDNESDAY, AND FRIDAY (Patient not taking: Reported on 01/19/2022)   No facility-administered medications prior to visit.   Reviewed past medical and social history.   ROS per HPI above  Last CBC Lab Results  Component Value Date   WBC 6.0 12/25/2010   HGB 10.2 (L) 12/25/2010   HCT 32.6 (L) 12/25/2010   MCV 97.0 12/25/2010   MCH 30.4 12/25/2010   RDW 12.9 12/25/2010   PLT 223 79/04/4095   Last metabolic panel Lab Results  Component Value Date   GLUCOSE 76 12/25/2010   NA 138 12/25/2010   K 3.6 12/25/2010   CL 102 12/25/2010   CO2 24 12/25/2010   BUN 42 (H) 12/25/2010   CREATININE 4.34 (H) 12/25/2010   CALCIUM 9.4 12/25/2010   Last lipids No results found for: "CHOL", "HDL", "LDLCALC", "LDLDIRECT", "TRIG", "CHOLHDL" Last hemoglobin A1c No results found for: "HGBA1C" Last thyroid functions No results found for: "TSH", "T3TOTAL", "T4TOTAL", "THYROIDAB"      Objective:  BP 136/82 (BP Location: Left Arm, Patient Position: Sitting, Cuff Size: Normal)   Pulse 75   Temp (!) 97.2 F (36.2 C) (Temporal)   Ht '5\' 3"'$  (1.6 m)   Wt 201 lb 9.6 oz (91.4 kg)   SpO2 96%   BMI 35.71 kg/m      Physical Exam Vitals reviewed.  Constitutional:      Appearance: She is obese.  Cardiovascular:     Rate and Rhythm: Normal rate and regular rhythm.     Pulses: Normal pulses.     Heart sounds: Normal heart sounds.  Pulmonary:     Effort: Pulmonary effort is normal.     Breath sounds: Normal breath sounds.  Musculoskeletal:      Right lower leg: No edema.     Left lower leg: No edema.  Neurological:     Mental Status: She is alert and oriented to person, place, and time.     No results found for any visits on 01/19/22.    Assessment & Plan:    Problem List Items Addressed This Visit       Cardiovascular and Mediastinum   Renovascular hypertension    Managed by Dr. Liam Graham (nephrology). Admits to not taking amlodipine and losartan Only takes labetalol BID at this time S/p kidney transplant x1, use of prograf and myfortic Managed by [facility] Reviewed labs results on patient's mobile device: CBC, CMP, and vit. D BP Readings from Last 3 Encounters:  01/19/22 136/82  09/16/17 116/68  04/06/15 112/78   Continue f/up with nephrology and [facility]  Relevant Medications   atorvastatin (LIPITOR) 10 MG tablet   torsemide (DEMADEX) 20 MG tablet     Respiratory   OSA (obstructive sleep apnea)    Reports compliance with CPAP machine but needs to replace tubes and mask.  Last sleep study ordered by nephrology: report requested. Entered referral to sleep clinic and sent order for CPAP supplies.      Relevant Orders   Ambulatory referral to Sleep Studies   For home use only DME continuous positive airway pressure (CPAP)     Other   Bilateral leg edema - Primary    Chronic, waxing and waning, no SOB or PND or CP No hx of CHF Use of torsemide prn No Le edema present at this time. Med refill sent.      Relevant Medications   torsemide (DEMADEX) 20 MG tablet   Bipolar disorder ([facility])    Current use of depakote. Stable mood Managed by Dr. Clovis Pu Denies need for psychology appts.      Immunosuppression ([facility])   Mixed hyperlipidemia   Relevant Medications   atorvastatin (LIPITOR) 10 MG tablet   torsemide (DEMADEX) 20 MG tablet   Obesity    S/p gastric sleeve 2021 Lowest weight 190lbs Heaveiest weight 265lbs Exercise: 3-4x/week: weight training and  cardio Diet: follows diet provided by nutritionist Hx of chronic GERD: symptoms controlled with omeprazole. We discussed use of GLP-1 injection as an additional weight loss tool and its possible side effects. I answered all her questions to her satisfaction. She plans to f/up [facility] to get wegovy rx Wt Readings from Last 3 Encounters:  01/19/22 201 lb 9.6 oz (91.4 kg)  04/06/15 227 lb 12.8 oz (103.3 kg)  05/05/13 243 lb (110.2 kg)        Other Visit Diagnoses     Need for Tdap vaccination       Relevant Orders   Tdap vaccine greater than or equal to 7yo IM (Completed)     Requested records from Nephrology and GYN.  Return in about 1 year (around 01/20/2023) for CPE (fasting).     Wilfred Lacy, NP

## 2022-01-19 NOTE — Patient Instructions (Addendum)
Please sign medical release to get colonoscopy, mammogram, PAP smear and lab results.  Thank you for choosing [facility]

## 2022-01-21 ENCOUNTER — Encounter: Payer: Self-pay | Admitting: Nurse Practitioner

## 2022-01-21 DIAGNOSIS — R600 Localized edema: Secondary | ICD-10-CM | POA: Insufficient documentation

## 2022-01-21 NOTE — Assessment & Plan Note (Addendum)
S/p gastric sleeve 2021 Lowest weight 190lbs Heaveiest weight 265lbs Exercise: 3-4x/week: weight training and cardio Diet: follows diet provided by nutritionist Hx of chronic GERD: symptoms controlled with omeprazole. We discussed use of GLP-1 injection as an additional weight loss tool and its possible side effects. I answered all her questions to her satisfaction. She plans to f/up with [facility] to get wegovy rx Wt Readings from Last 3 Encounters:  01/19/22 201 lb 9.6 oz (91.4 kg)  04/06/15 227 lb 12.8 oz (103.3 kg)  05/05/13 243 lb (110.2 kg)

## 2022-01-21 NOTE — Assessment & Plan Note (Signed)
Chronic, waxing and waning, no SOB or PND or CP No hx of CHF Use of torsemide prn No Le edema present at this time. Med refill sent.

## 2022-01-21 NOTE — Assessment & Plan Note (Signed)
Current use of depakote. Stable mood Managed by Dr. Clovis Pu Denies need for psychology appts.

## 2022-01-21 NOTE — Assessment & Plan Note (Signed)
Reports compliance with CPAP machine but needs to replace tubes and mask.  Last sleep study ordered by nephrology: report requested. Entered referral to sleep clinic and sent order for CPAP supplies.

## 2022-01-21 NOTE — Assessment & Plan Note (Addendum)
Managed by Dr. Liam Graham (nephrology). Admits to not taking amlodipine and losartan Only takes labetalol BID at this time S/p kidney transplant x1, use of prograf and myfortic Managed by [facility] Reviewed labs results on patient's mobile device: CBC, CMP, and vit. D BP Readings from Last 3 Encounters:  01/19/22 136/82  09/16/17 116/68  04/06/15 112/78   Continue f/up with nephrology and [facility]

## 2022-01-28 ENCOUNTER — Telehealth: Payer: Self-pay | Admitting: Nurse Practitioner

## 2022-01-28 NOTE — Telephone Encounter (Signed)
CPAP has been faxed over to [facility]

## 2022-01-28 NOTE — Telephone Encounter (Signed)
Brad from [facility] needs to know if we accidentally sent over a script for a full face c-pap mask to their company. He noticed on the bottom of the order it stated it was suppose to be sent to [facility]. He is wondering if his company should process this order or if it should be sent to [facility]. His direct line is   [facility] fax # [phone]

## 2022-02-02 ENCOUNTER — Telehealth: Payer: Self-pay

## 2022-02-02 NOTE — Telephone Encounter (Signed)
Called & left pt a VM, Adv to call back to discuss referral. Per Carolyn Chang, " She needs to get previous sleep study from nephrologist and bring to office. This is needed before she can schedule an appt with the sleep clinic."

## 2022-02-15 ENCOUNTER — Other Ambulatory Visit: Payer: Self-pay | Admitting: Nurse Practitioner

## 2022-02-15 DIAGNOSIS — R600 Localized edema: Secondary | ICD-10-CM

## 2022-02-16 NOTE — Telephone Encounter (Signed)
Chart supports Rx Last OV: 12/2021 Next OV: not scheduled

## 2022-02-17 LAB — CBC AND DIFFERENTIAL
HCT: 34 — AB (ref 36–46)
Hemoglobin: 11.9 — AB (ref 12.0–16.0)
Neutrophils Absolute: 1.5
Platelets: 191 10*3/uL (ref 150–400)
WBC: 5.2

## 2022-02-17 LAB — BASIC METABOLIC PANEL
BUN: 14 (ref 4–21)
Chloride: 104 (ref 99–108)
Creatinine: 0.9 (ref 0.5–1.1)
Glucose: 84
Potassium: 3.8 mEq/L (ref 3.5–5.1)
Sodium: 140 (ref 137–147)

## 2022-02-17 LAB — LIPID PANEL
Cholesterol: 181 (ref 0–200)
HDL: 54 (ref 35–70)
LDl/HDL Ratio: 2
Triglycerides: 101 (ref 40–160)

## 2022-02-17 LAB — HEPATIC FUNCTION PANEL
ALT: 20 U/L (ref 7–35)
AST: 29 (ref 13–35)
Alkaline Phosphatase: 75 (ref 25–125)
Bilirubin, Total: 0.3

## 2022-02-17 LAB — CBC: RBC: 3.65 — AB (ref 3.87–5.11)

## 2022-02-17 LAB — TSH: TSH: 3.21 (ref 0.41–5.90)

## 2022-02-17 LAB — COMPREHENSIVE METABOLIC PANEL
Albumin: 3.7 (ref 3.5–5.0)
Calcium: 9.5 (ref 8.7–10.7)
Globulin: 3.2

## 2022-02-26 ENCOUNTER — Encounter: Payer: Self-pay | Admitting: Nurse Practitioner

## 2022-02-27 NOTE — Telephone Encounter (Signed)
Abstracted into pt's chart

## 2022-03-01 ENCOUNTER — Other Ambulatory Visit: Payer: Self-pay | Admitting: Psychiatry

## 2022-03-01 DIAGNOSIS — F319 Bipolar disorder, unspecified: Secondary | ICD-10-CM

## 2022-03-05 ENCOUNTER — Encounter: Payer: Self-pay | Admitting: Psychiatry

## 2022-03-05 ENCOUNTER — Ambulatory Visit (INDEPENDENT_AMBULATORY_CARE_PROVIDER_SITE_OTHER): Payer: BC Managed Care – PPO | Admitting: Psychiatry

## 2022-03-05 DIAGNOSIS — F319 Bipolar disorder, unspecified: Secondary | ICD-10-CM | POA: Diagnosis not present

## 2022-03-05 DIAGNOSIS — R7989 Other specified abnormal findings of blood chemistry: Secondary | ICD-10-CM | POA: Diagnosis not present

## 2022-03-05 MED ORDER — DIVALPROEX SODIUM ER 500 MG PO TB24: 1000.0000 mg | ORAL_TABLET | Freq: Every day | ORAL | 3 refills | Status: DC

## 2022-03-05 NOTE — Progress Notes (Signed)
Carolyn Chang [id number] November 06, 1970 51 y.o.   Subjective:   Patient ID:  Carolyn Chang is a 51 y.o. (DOB February 01, 1971) female.  Chief Complaint:  Chief Complaint  Patient presents with   Follow-up    Bipolar I disorder ([place])    HPI Carolyn Chang presents for follow-up of bipolar disorder.  seen August 2019.  She was stable at the time and no meds were changed.  She had had an episode of mania after a colon cleanse but that resolved early 2019.  Otherwise stable for years.  03/05/20 appt with following noted: Bored working from home.   Vaccinated.   H not ready to go out yet. Had Gastric sleeve Feb 2021 and lost from 254# to 194#. No problems with Depakote ER 1000 mg HS and is consistent.  03/05/21 appt noted: Only psych med is Depakote ER 1000 mg nightly and is consistent with it. No side effect Work from home but got Covid once in July after travel.  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  03/05/22 appt noted: Only psych med is Depakote ER 1000 mg nightly and is consistent with it. No SE.  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   Doing well.   Working from home and going [place].  Likes working from home.  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  Managing and tries to walk in the morning and walks.  Patient reports stable mood and denies depressed or irritable moods.  No mania.  Patient denies any recent difficulty with anxiety.  Patient denies difficulty with sleep initiation or maintenance 5-7 hours. Sometimes to the gym. Denies appetite disturbance.  Patient reports that energy and motivation have been good.  Patient denies any difficulty with concentration.  Patient denies any suicidal ideation. D going with her to [place] where she lives.    Past Psychiatric Medication Trials: Depakote ER 1000 mg since under our care July 2003. Haldol side effects no history of antidepressants.   Diagnosed bipolar disorder 1995. History lithium toxicity  Review of Systems:  Review of Systems   Gastrointestinal:  Negative for abdominal pain and nausea.  Neurological:  Negative for tremors.    Medications: I have reviewed the patient's current medications.  Current Outpatient Medications  Medication Sig Dispense Refill   aspirin 81 MG tablet Take 81 mg by mouth daily.     atorvastatin (LIPITOR) 10 MG tablet Take 1 tablet (10 mg total) by mouth at bedtime. 90 tablet 3   Calcium Acetate, Phos Binder, (CALCIUM ACETATE PO) Take by mouth.     ferrous sulfate 325 (65 FE) MG tablet Take 325 mg by mouth daily with breakfast.     influenza vaccine (FLUCELVAX QUADRIVALENT) 0.5 ML injection Flucelvax Quad 2019-2020 (PF) 60 mcg (15 mcg x 4)/0.5 mL IM syringe  TO BE ADMINISTERED BY PHARMACIST FOR IMMUNIZATION     labetalol (NORMODYNE) 100 MG tablet Take 200 mg by mouth 2 (two) times daily.     Magnesium 400 MG CAPS Take 1 capsule by mouth. Reported on 04/06/2015     Multiple Vitamin (MULTIVITAMIN) capsule Take 1 capsule by mouth daily.     mupirocin ointment (BACTROBAN) 2 %      MYFORTIC 180 MG EC tablet Take by mouth.     omeprazole (PRILOSEC) 20 MG capsule Take 20 mg by mouth daily.     tacrolimus (PROGRAF) 1 MG capsule Take 1 mg by mouth 2 (two) times daily.     torsemide (DEMADEX) 20 MG tablet TAKE 1 TABLET  BY MOUTH EVERY DAY AS NEEDED 90 tablet 1   Zinc Acetate, Oral, (ZINC ACETATE PO) Take by mouth.     divalproex (DEPAKOTE ER) 500 MG 24 hr tablet Take 2 tablets (1,000 mg total) by mouth at bedtime. 180 tablet 3   No current facility-administered medications for this visit.    Medication Side Effects: None  Allergies: No Known Allergies  Past Medical History:  Diagnosis Date   Bipolar 2 disorder ([place])    Chronic kidney disease    Seconday to Lithium Treatment    Hyperlipidemia    Hypertension    Obesity    Sleep apnea    CPAP   Transplantation of kidney as the cause of abnormal reaction of patient, or of later complication, without misadventure at the time of operation  07/14/2018    Family History  Problem Relation Age of Onset   Alcohol abuse Father    Arthritis Father    Cirrhosis Father    Birth defects Sister        malformation of lungs   Lung disease Sister    Diabetes Maternal Grandmother     Social History   Socioeconomic History   Marital status: Married    Spouse name: [name]    Number of children: 1   Years of education: 16   Highest education level: Not on file  Occupational History   Occupation: CUSTOMER SERVICE    Employer: [employer]    Comment: [employer]  Tobacco Use   Smoking status: Never   Smokeless tobacco: Never  Substance and Sexual Activity   Alcohol use: Yes    Alcohol/week: 1.0 standard drink of alcohol    Types: 1 Glasses of wine per week   Drug use: No   Sexual activity: Yes    Partners: Male    Birth control/protection: None  Other Topics Concern   Not on file  Social History Narrative   Marital Status:  Married [name])   Children:  Daughter [name])    Pets: None    Living Situation: Lives with husband and daughter.   Occupation:  Engineer, [employer])    Education: Dietitian    Tobacco Use/Exposure:  None    Alcohol Use:  Occasional   Drug Use:  None   Diet:  Regular   Exercise:  Four times weekly    Hobbies: Reading                   Social Determinants of Health   Financial Resource Strain: Not on file  Food Insecurity: Not on file  Transportation Needs: Not on file  Physical Activity: Not on file  Stress: Not on file  Social Connections: Not on file  Intimate Partner Violence: Not on file    Past Medical History, Surgical history, Social history, and Family history were reviewed and updated as appropriate.   Please see review of systems for further details on the patient's review from today.   Objective:   Physical Exam:  There were no vitals taken for this visit.  Physical Exam Constitutional:       General: She is not in acute distress. Musculoskeletal:        General: No deformity.  Neurological:     Mental Status: She is alert and oriented to person, place, and time.     Cranial Nerves: No dysarthria.     Coordination: Coordination normal.  Psychiatric:  Attention and Perception: Attention and perception normal. She does not perceive auditory or visual hallucinations.        Mood and Affect: Mood normal. Mood is not anxious or depressed. Affect is not labile, blunt or angry.        Speech: Speech normal.        Behavior: Behavior normal. Behavior is cooperative.        Thought Content: Thought content normal. Thought content is not paranoid or delusional. Thought content does not include homicidal or suicidal ideation.        Cognition and Memory: Cognition and memory normal.        Judgment: Judgment normal.     Comments: Insight good.     Lab Review:     Component Value Date/Time   NA 140 02/17/2022 0000   K 3.8 02/17/2022 0000   CL 104 02/17/2022 0000   CO2 24 12/25/2010 0949   GLUCOSE 76 12/25/2010 0949   BUN 14 02/17/2022 0000   CREATININE 0.9 02/17/2022 0000   CREATININE 4.34 (H) 12/25/2010 0949   CALCIUM 9.5 02/17/2022 0000   ALBUMIN 3.7 02/17/2022 0000   AST 29 02/17/2022 0000   ALT 20 02/17/2022 0000   ALKPHOS 75 02/17/2022 0000   GFRNONAA 12 (L) 12/25/2010 0949   GFRAA 14 (L) 12/25/2010 0949       Component Value Date/Time   WBC 5.2 02/17/2022 0000   WBC 6.0 12/25/2010 0949   RBC 3.65 (A) 02/17/2022 0000   HGB 11.9 (A) 02/17/2022 0000   HCT 34 (A) 02/17/2022 0000   PLT 191 02/17/2022 0000   MCV 97.0 12/25/2010 0949   MCH 30.4 12/25/2010 0949   MCHC 31.3 12/25/2010 0949   RDW 12.9 12/25/2010 0949    No results found for: "POCLITH", "LITHIUM"   No results found for: "PHENYTOIN", "PHENOBARB", "VALPROATE", "CBMZ"   .res Assessment: Plan:    Carolyn Chang was seen today for follow-up.  Diagnoses and all orders for this visit:  Bipolar I  disorder ([place]) -     divalproex (DEPAKOTE ER) 500 MG 24 hr tablet; Take 2 tablets (1,000 mg total) by mouth at bedtime.  Low vitamin D level      We discussed the issues noted below   As noted she did have a manic episode last year when using a colon cleanse product but it resolved with discontinuation of that product.  Nephrologist doesn't want her to weight loss pills either.  She understands that some of them can cause mania because we discussed it.  Had gastric sleeve possibility and absorption of Depakote ER.  Should be OK as long as not doing gastric bypass which would be a problem.  No psychiatric  Contraindication for this procedure and she's lost 65#.  No med change indicated.  Continue Depakote ER 1000 mg daily as the only psychiatric medication.  Discussed side effect possibilities with Depakote including rash and liver toxicity as well as more common side effects such as balance problems and fatigue.  She is done well with that.  Consider checking level but stability suggests no change indicated.   FU 1 year bc stable  Lynder Parents, MD, DFAPA   No future appointments.  No orders of the defined types were placed in this encounter.     -------------------------------

## 2022-03-10 ENCOUNTER — Ambulatory Visit (INDEPENDENT_AMBULATORY_CARE_PROVIDER_SITE_OTHER): Payer: BC Managed Care – PPO | Admitting: Behavioral Health

## 2022-03-10 DIAGNOSIS — F319 Bipolar disorder, unspecified: Secondary | ICD-10-CM | POA: Diagnosis not present
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
## 2022-03-18 ENCOUNTER — Encounter: Payer: Self-pay | Admitting: Behavioral Health

## 2022-03-27 ENCOUNTER — Ambulatory Visit (INDEPENDENT_AMBULATORY_CARE_PROVIDER_SITE_OTHER): Payer: No Typology Code available for payment source | Admitting: Behavioral Health

## 2022-03-27 DIAGNOSIS — F319 Bipolar disorder, unspecified: Secondary | ICD-10-CM
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
## 2022-03-30 ENCOUNTER — Encounter: Payer: Self-pay | Admitting: Behavioral Health

## 2022-03-30 IMAGING — CT CT ABD-PELV W/ CM
1 of 3 series · 13 of 32 positions shown, 19 images · IV contrast (APPLIED)
Comparison: None.

CLINICAL DATA: Uterine fibroids.  Renal transplant patient.

Creatinine was obtained on site at [HOSPITAL] at [HOSPITAL].
Results: Creatinine 0.9 mg/dL.
EXAM:
CT ABDOMEN AND PELVIS WITH CONTRAST
TECHNIQUE: Multidetector CT imaging of the abdomen and pelvis was performed
using the standard protocol following bolus administration of
intravenous contrast.
CONTRAST:  100mL GAIV5C-A22 IOPAMIDOL (GAIV5C-A22) INJECTION 61%

[Series 2: abd/pelvis w/cm · axial · 0.79mm/px · z∈[-468,-63]mm · 13 of 95 slices shown, 19 images]
[im 7/95  soft-tissue]
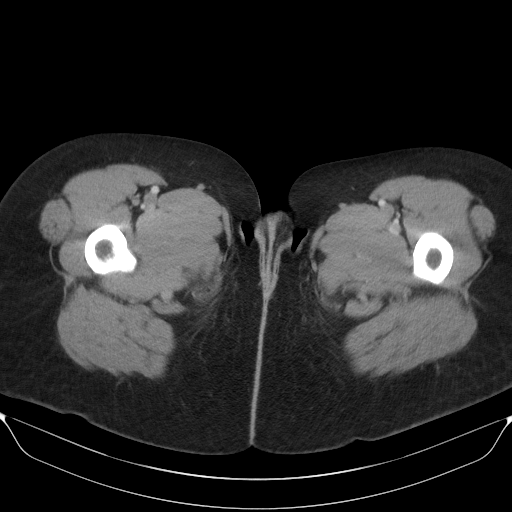
[im 7/95  bone]
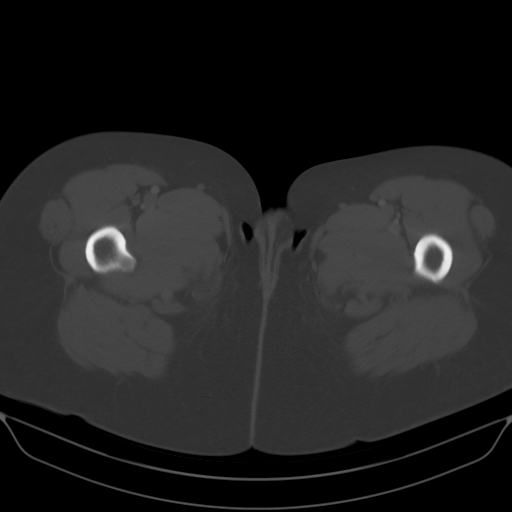
[im 14/95  soft-tissue]
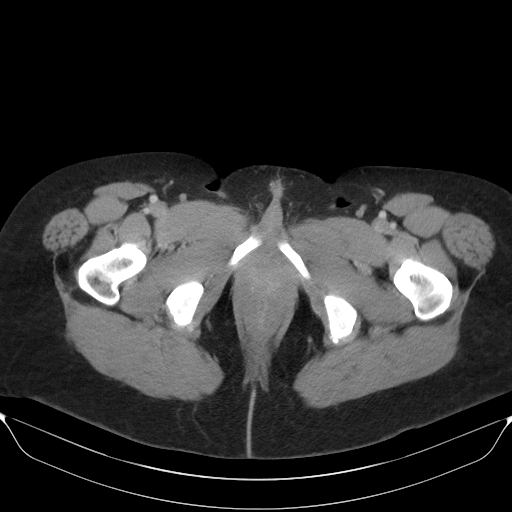
[im 21/95  soft-tissue]
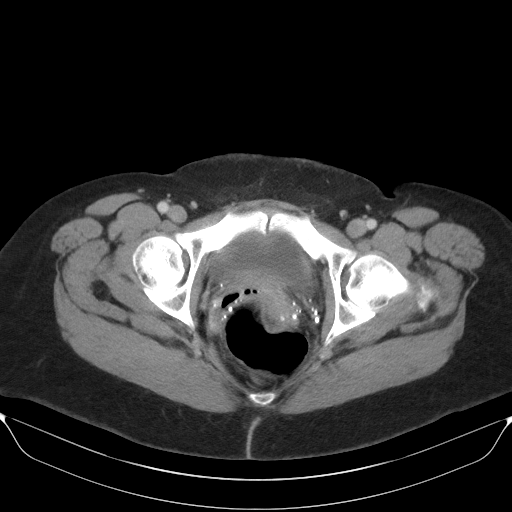
[im 27/95  soft-tissue]
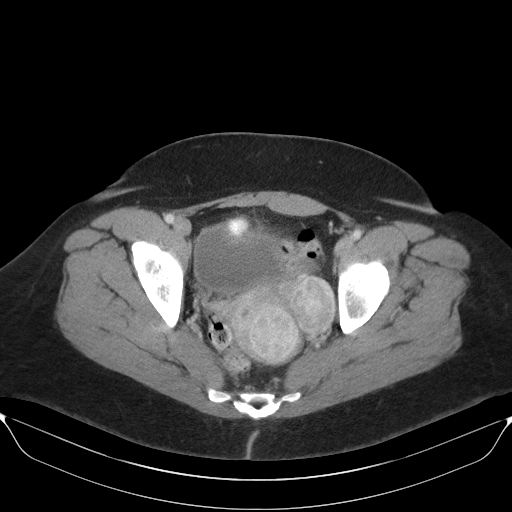
[im 34/95  soft-tissue]
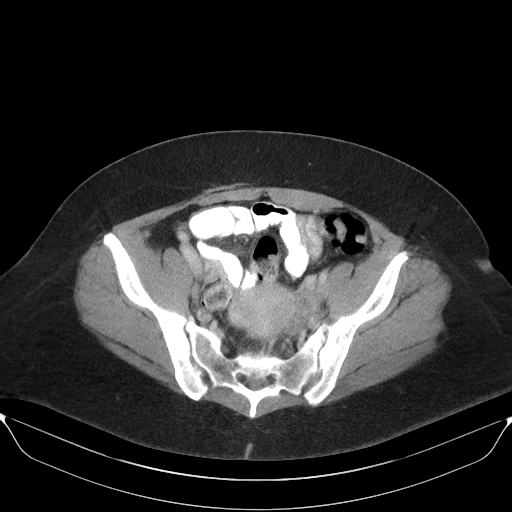
[im 41/95  soft-tissue]
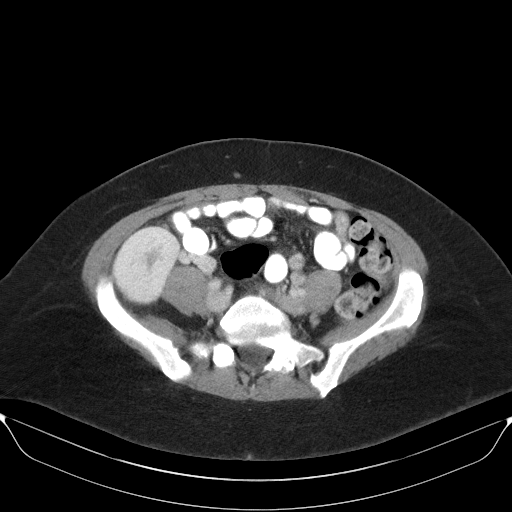
[im 48/95  soft-tissue]
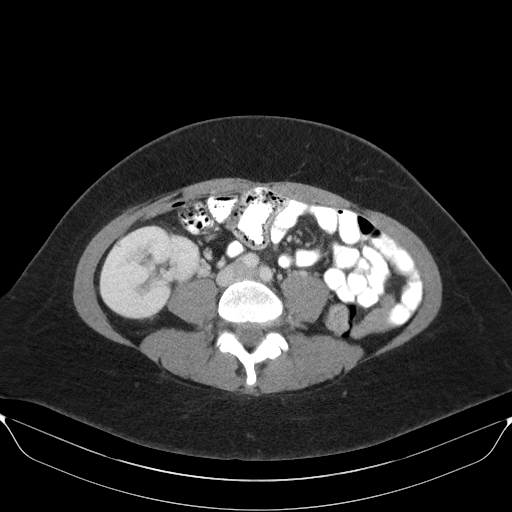
[im 54/95  soft-tissue]
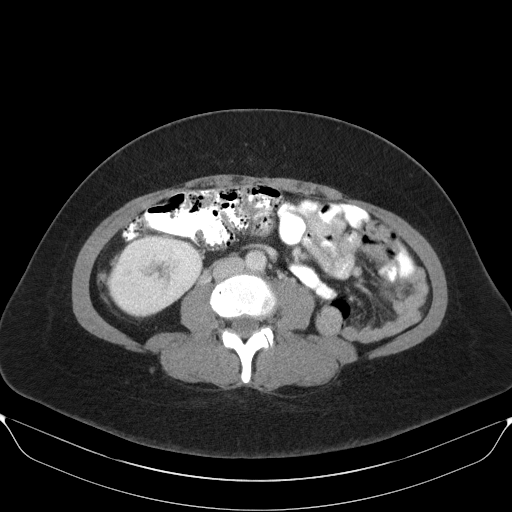
[im 61/95  soft-tissue]
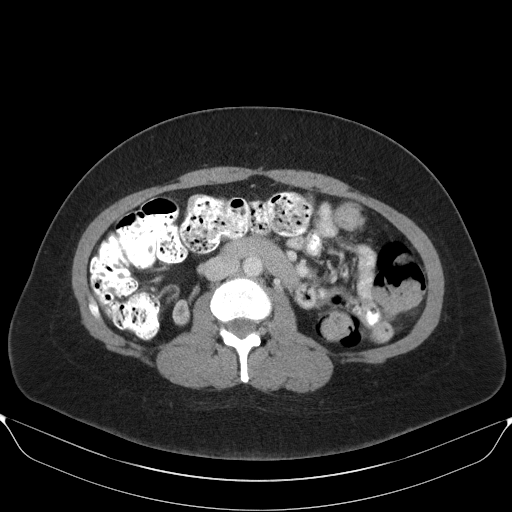
[im 61/95  bone]
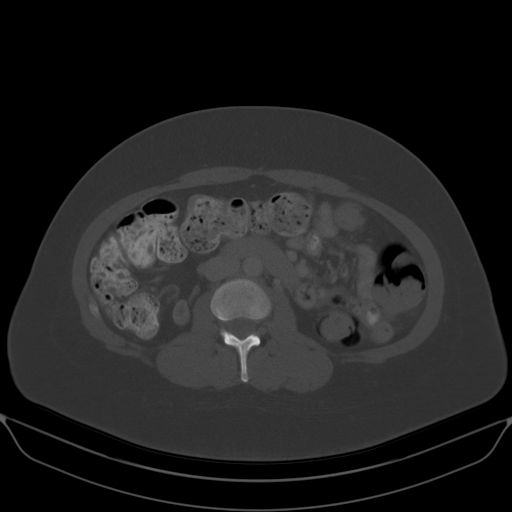
[im 68/95  soft-tissue]
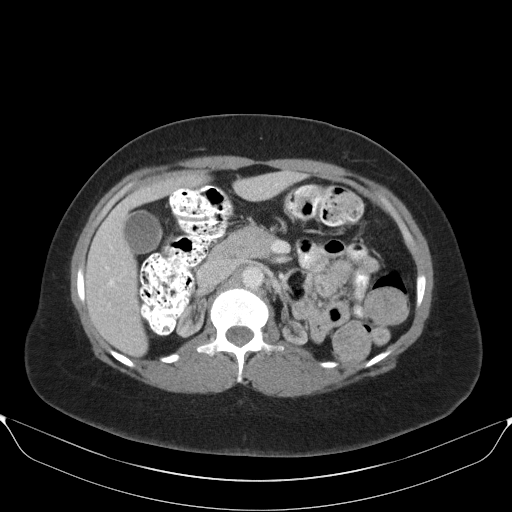
[im 68/95  lung]
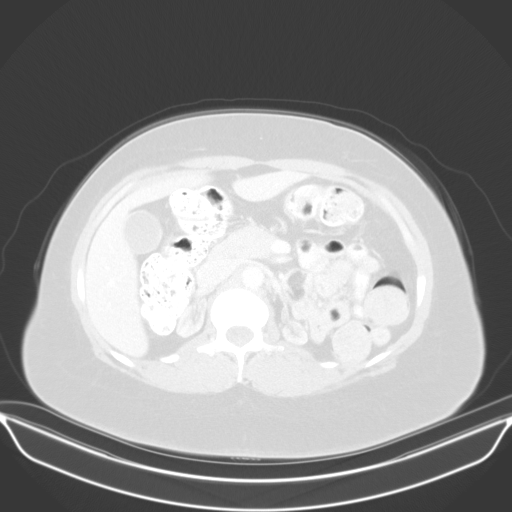
[im 74/95  soft-tissue]
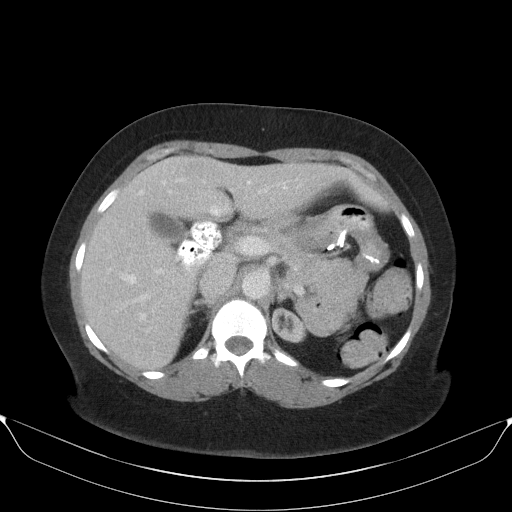
[im 74/95  lung]
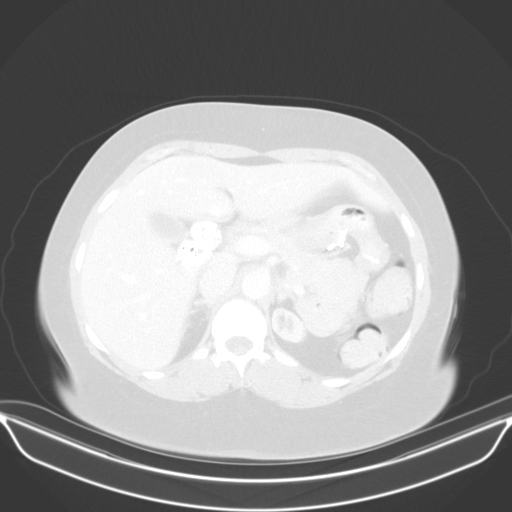
[im 81/95  soft-tissue]
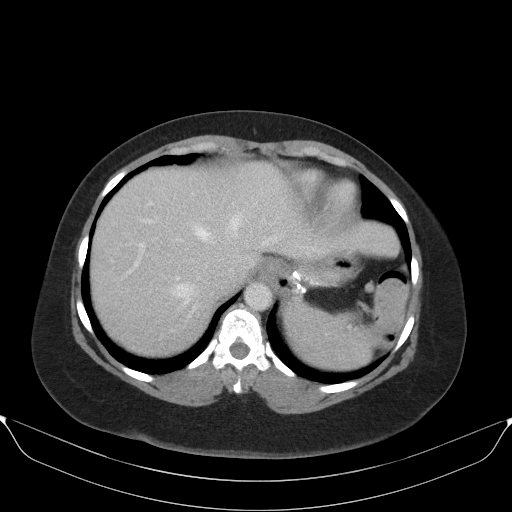
[im 81/95  lung]
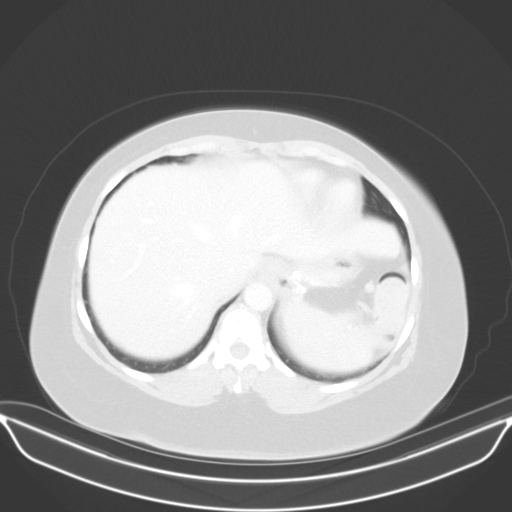
[im 88/95  soft-tissue]
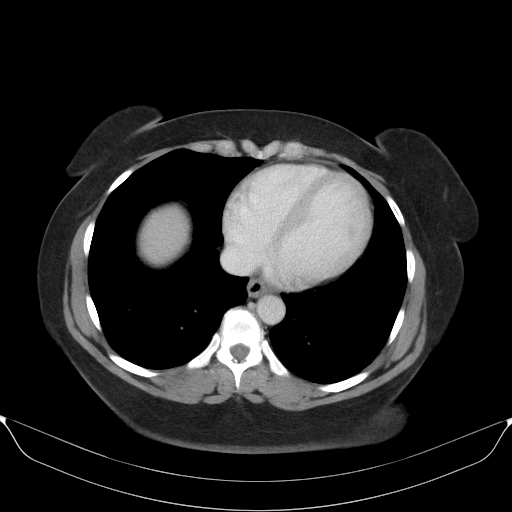
[im 88/95  lung]
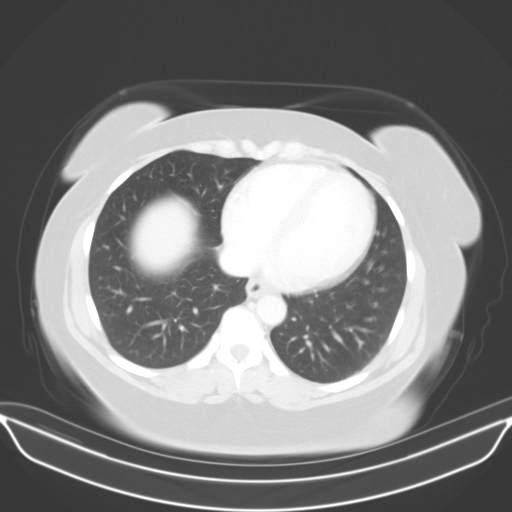

[13 of 32 positions shown; findings below may reference images not displayed]

FINDINGS: Lower Chest: No acute findings.

Hepatobiliary: No hepatic masses identified. Gallbladder is
unremarkable. No evidence of biliary ductal dilatation.

Pancreas:  No mass or inflammatory changes.

Spleen: Within normal limits in size and appearance.

Adrenals/Urinary Tract: Normal adrenal glands. Diffuse atrophy of
native kidneys is seen. Renal transplant is seen in the right iliac
fossa. The renal transplant is normal in appearance, without
evidence of mass, hydronephrosis, or perinephric fluid. Unremarkable
unopacified urinary bladder.

Stomach/Bowel: Prior sleeve gastrectomy noted. No evidence of
obstruction, inflammatory process or abnormal fluid collections.

Vascular/Lymphatic: No pathologically enlarged lymph nodes. No
abdominal aortic aneurysm.

Reproductive: 4 small uterine fibroids are seen, largest measuring
4.7 cm. A 4.3 cm fibroid is seen arising from the left lateral
uterine wall which is pedunculated and extends into the right
adnexa. Tubal ligation clips are noted. No other pelvic mass or
abnormal fluid collections identified.

Other:  None.

Musculoskeletal:  No suspicious bone lesions identified.
IMPRESSION: No acute findings.

Several small uterine fibroids, largest measuring 4.7 cm.

Right iliac fossa renal transplant, which is normal in appearance.

## 2022-04-08 ENCOUNTER — Ambulatory Visit: Payer: BC Managed Care – PPO | Admitting: Behavioral Health

## 2022-04-22 ENCOUNTER — Encounter: Payer: Self-pay | Admitting: Behavioral Health

## 2022-04-22 ENCOUNTER — Ambulatory Visit (INDEPENDENT_AMBULATORY_CARE_PROVIDER_SITE_OTHER): Payer: No Typology Code available for payment source | Admitting: Behavioral Health

## 2022-04-22 DIAGNOSIS — F319 Bipolar disorder, unspecified: Secondary | ICD-10-CM

## 2022-04-22 NOTE — Progress Notes (Signed)
[facility] Counselor/Therapist Progress Note  Patient ID: Abri Vacca, MRN: [id number],    Date: 04/22/2022  Time Spent:  50 minutes  Treatment Type: Individual Therapy  Reported Symptoms: The patient reports "I think I am doing pretty good".   Mental Status Exam:  Appearance:   Casual     Behavior:  Appropriate and Sharing  Motor:  Normal  Speech/Language:   Clear and Coherent  Affect:  Appropriate and Congruent  Mood:  normal  Thought process:  normal  Thought content:    WNL  Sensory/Perceptual disturbances:    WNL  Orientation:  oriented to person  Attention:  Good  Concentration:  Good  Memory:  WNL  Fund of knowledge:   Good  Insight:    Good  Judgment:   Good  Impulse Control:  Good   Risk Assessment: Danger to Self:  No Self-injurious Behavior: No Danger to Others: No Duty to Warn:no Physical Aggression / Violence:No  Access to Firearms a concern: No  Gang Involvement:No   Subjective:   The patient reports recently having a change on her current job regarding her responsibilities. 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 She reports looking for another job due to not wanting to do the job she was changed to. She states her moods where temporarily affected, however she reports reframing her negative thoughts into positive thoughts of "At least I still have a job". She reports plans to go out of town to visit family for her mother's birthday. The patient rated her depression at a 2 and rated her anxiety at a 2 on a scale of 1 to 10 with 10 being severe. The patient reports plans to continue to look into continuing her education. She states plans to look into options for school tuition reimbursement with her current employer. The patient reports belief that she has improved her communication skills. She states she utilized assertive communication skills with her employer in order to continue working her current shift. She reports she remains  compliant with her medication regimen. She states plans to obtain a planner in order to start doing daily devotions and reports she has began exercising again. The patient reports daily devotions were helpful with her motivation and determination. She reports a planner helps her with making to do list to stay on track with her routine tasks. Therapy goals were reviewed with the patient and she states feeling good about the progress. The patient identified her take away as "Standing up for myself about the change with my job that I am on the right track".  Counselor discussed mental health symptoms experienced and requested the patient rate symptoms. Counselor discussed medication compliance. Counselor discussed coping strategies utilized. Counselor reviewed and dicussed the patients progress with her goals. Counselor educated the patient on thoughts/feelings/behaviors are connected. Counselor discussed utilizing affirmations and positive self talk and provided the patient with spiritual positive affirmations at her request and affirmations for depression. Counselor assessed for SI/HI.      Interventions: Solution-Oriented/Positive Psychology  Diagnosis:   ICD-10-CM   1. Bipolar I disorder ([place])  F31.9       Plan:   The patient states she would like to develop goals to learn techniques to address employment occupational goals, effective communication skills and anxiety.     1) Long Term Goal: Explore and resolve the decision to find a new job, keep present job or enroll in an institute of higher learning in order to receive  vocational training to obtain marketable employment skills.      Short Term Goal: Improve decision making skills.     Objective: Explore options for returning to school/training.     Objective: Find and settle into a new job.    2) Long Term Goal: Learn and use effective communication strategies.      Short Term Goal: Learn and implement problem solving and conflict  resolution skills.     Objective:  Speak in a clear and concise manner so others fully understand her.   3) Long Term Goal: Enhance ability to effectively cope with life's worries and anxieties.      Short Term Goal: Reduce anxiety and improve coping skills.     Objective: Learn two  new ways of coping with routine stressors.     Objective: Report feeling more positive about self and abilities during therapy sessions.      Jannifer Hick, [facility]

## 2022-04-28 ENCOUNTER — Encounter: Payer: Self-pay | Admitting: Nurse Practitioner

## 2022-04-30 ENCOUNTER — Other Ambulatory Visit (HOSPITAL_COMMUNITY): Payer: Self-pay

## 2022-04-30 NOTE — Telephone Encounter (Signed)
Pharmacy Patient Advocate Encounter  Prior Authorization for Omeprazole 40 mg has been approved.    PA# [id number] Effective dates: 04/29/2021 through 04/29/2025

## 2022-05-07 ENCOUNTER — Ambulatory Visit: Payer: No Typology Code available for payment source | Admitting: Behavioral Health

## 2022-05-28 ENCOUNTER — Ambulatory Visit (INDEPENDENT_AMBULATORY_CARE_PROVIDER_SITE_OTHER): Payer: No Typology Code available for payment source | Admitting: Behavioral Health

## 2022-05-28 DIAGNOSIS — F319 Bipolar disorder, unspecified: Secondary | ICD-10-CM | POA: Diagnosis not present

## 2022-05-28 NOTE — Progress Notes (Signed)
[facility] Counselor/Therapist Progress Note  Patient ID: Carolyn Chang, MRN: [id number],    Date: 05/28/2022  Time Spent: ***   Treatment Type: {[facility] AMB THERAPY [phone]}  Reported Symptoms: ***  Mental Status Exam:  Appearance:   {PSY:22683}     Behavior:  {PSY:21022743}  Motor:  {PSY:22302}  Speech/Language:   {PSY:22685}  Affect:  {PSY:22687}  Mood:  {PSY:31886}  Thought process:  {PSY:31888}  Thought content:    {PSY:970-375-9321}  Sensory/Perceptual disturbances:    {PSY:3368035914}  Orientation:  {PSY:30297}  Attention:  {PSY:22877}  Concentration:  {PSY:386-119-8231}  Memory:  {PSY:970-575-2316}  Fund of knowledge:   {PSY:386-119-8231}  Insight:    {PSY:386-119-8231}  Judgment:   {PSY:386-119-8231}  Impulse Control:  {PSY:386-119-8231}   Risk Assessment: Danger to Self:  No Self-injurious Behavior: No Danger to Others: No Duty to Warn:no Physical Aggression / Violence:No  Access to Firearms a concern: No  Gang Involvement:No   Subjective: ***   The patient reports doing "Good" she reports she recently spent time with her family in February in [place]. 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 She reports plans to soon relocated to another city the patient states plans to continue receiving her medication management with [facility]  Patient reports she is managing anxiety well she states to exercise daily  Patient reports she remains medication compliant and states that the medication is helpful  The patient reports belief that her communication methods have improved  She reports to ensure she an understanding of what others expectations are as well when she communicates  Patient rated her depression and anxiety at a 2 today  Patient denied SI/HI,  AH/VH  Patient was informed this counselor time is limited with [facility] and she requested to remain with this therapist it was explained a transition plan will soon be in effect     Interventions: {PSY:[phone]}  Diagnosis:No diagnosis found.  Plan: ***   The patient states she would like to develop goals to learn techniques to address employment occupational goals, effective communication skills and anxiety.     1) Long Term Goal: Explore and resolve the decision to find a new job, keep present job or enroll in an institute of higher learning in order to receive vocational training to obtain marketable employment skills.      Short Term Goal: Improve decision making skills.     Objective: Explore options for returning to school/training.     Objective: Find and settle into a new job.    2) Long Term Goal: Learn and use effective communication strategies.      Short Term Goal: Learn and implement problem solving and conflict resolution skills.     Objective:  Speak in a clear and concise manner so others fully understand her.   3) Long Term Goal: Enhance ability to effectively cope with life's worries and anxieties.      Short Term Goal: Reduce anxiety and improve coping skills.     Objective: Learn two  new ways of coping with routine stressors.     Objective: Report feeling more positive about self and abilities during therapy sessions.        Jannifer Hick, [place] [facility], [facility]

## 2022-06-03 ENCOUNTER — Encounter: Payer: Self-pay | Admitting: Behavioral Health

## 2022-06-17 ENCOUNTER — Ambulatory Visit: Payer: No Typology Code available for payment source | Admitting: Behavioral Health

## 2022-06-17 DIAGNOSIS — F319 Bipolar disorder, unspecified: Secondary | ICD-10-CM

## 2022-06-17 NOTE — Progress Notes (Signed)
[facility] Counselor/Therapist Progress Note  Patient ID: Carolyn Chang, MRN: [id number],    Date: 06/17/2022  Time Spent: 40 minutes  Treatment Type: Individual Therapy  Reported Symptoms: None reported  Mental Status Exam:  Appearance:   Casual     Behavior:  Appropriate and Sharing  Motor:  Normal  Speech/Language:   Clear and Coherent  Affect:  Appropriate and Congruent  Mood:  normal  Thought process:  normal  Thought content:    WNL  Sensory/Perceptual disturbances:    WNL  Orientation:  oriented to person  Attention:  Good  Concentration:  Good  Memory:  WNL  Fund of knowledge:   Good  Insight:    Good  Judgment:   Good  Impulse Control:  Good   Risk Assessment: Danger to Self:  No Self-injurious Behavior: No Danger to Others: No Duty to Warn:no Physical Aggression / Violence:No  Access to Firearms a concern: No  Gang Involvement:No   Subjective:   The patient reports she is doing well. She reports no changes have occurred with her current occupation, however she states she is continuing to look for another job. She presents with uncertainty with continuing her education. She reports her family continues to plan to relocate to another area. The patient states she remains compliant with her medication regimen she states the medication is helpful. The patient denied experiencing anxiety she states to experience frustration due to not being able to change her occupation. She states to encourage herself via use of positive self talk as a coping strategy to maintain her mood stability. In addition, the patient reports exercising helps her maintain her mood stability. She reports no current symptoms of depression or anxiety. The patient reports belief that she has improved on her communication skills at home and work when needed. She denied SI/HI, AH/VH. The patient reported interest with continuing to receive therapy with this counselor. She was provided this  counselors contact information to ensure continuity of care and a release of information was signed for this counselor to provide therapy.  Counselor conducted check in. Counselor discussed the patients mental health symptoms and coping strategies utilized. Counselor reviewed the patients progress towards her therapeutic goals. Counselor assessed for SI/HI, AH/VH. Counselor questioned the patients interest with continuing to receive psychotherapy and provided the patient with this counselors contact information. The counselor requested that the patient sign a release of information with [facility] for this counselors new employer to ensure continuity of care.     Interventions: Motivational Interviewing  Diagnosis:   ICD-10-CM   1. Bipolar I disorder ([place])  F31.9       Plan:    The patient states she would like to develop goals to learn techniques to address employment occupational goals, effective communication skills and anxiety.     1) Long Term Goal: Explore and resolve the decision to find a new job, keep present job or enroll in an institute of higher learning in order to receive vocational training to obtain marketable employment skills.      Short Term Goal: Improve decision making skills.     Objective: Explore options for returning to school/training.     Objective: Find and settle into a new job.    2) Long Term Goal: Learn and use effective communication strategies.      Short Term Goal: Learn and implement problem solving and conflict resolution skills.     Objective:  Speak in a clear and concise  manner so others fully understand her.   3) Long Term Goal: Enhance ability to effectively cope with life's worries and anxieties.      Short Term Goal: Reduce anxiety and improve coping skills.     Objective: Learn two  new ways of coping with routine stressors.     Objective: Report feeling more positive about self and abilities during therapy sessions.       Jannifer Hick, [facility]

## 2022-06-21 ENCOUNTER — Encounter: Payer: Self-pay | Admitting: Behavioral Health

## 2022-07-21 ENCOUNTER — Other Ambulatory Visit (HOSPITAL_COMMUNITY): Payer: Self-pay

## 2022-07-21 MED ORDER — WEGOVY 0.25 MG/0.5ML ~~LOC~~ SOAJ
0.2500 mg | SUBCUTANEOUS | 0 refills | Status: DC
  Filled 2022-07-21: qty 2, 28d supply, fill #0

## 2022-08-10 ENCOUNTER — Other Ambulatory Visit: Payer: Self-pay | Admitting: Nurse Practitioner

## 2022-08-10 DIAGNOSIS — R600 Localized edema: Secondary | ICD-10-CM

## 2022-12-07 ENCOUNTER — Telehealth: Payer: Self-pay

## 2022-12-07 NOTE — Telephone Encounter (Signed)
Received form from [employer]. Left pt a VM to verify form. Form on CMA desk in Pod B

## 2022-12-08 NOTE — Telephone Encounter (Signed)
I spoke with patient and she is ok with sending [employer] her information.

## 2022-12-09 NOTE — Telephone Encounter (Signed)
I spoke with patient and she is ok with sending necessary information to [facility].

## 2023-02-01 ENCOUNTER — Encounter: Payer: Self-pay | Admitting: Dermatology

## 2023-02-01 ENCOUNTER — Ambulatory Visit: Payer: No Typology Code available for payment source | Admitting: Dermatology

## 2023-02-01 DIAGNOSIS — L409 Psoriasis, unspecified: Secondary | ICD-10-CM

## 2023-02-01 DIAGNOSIS — R238 Other skin changes: Secondary | ICD-10-CM

## 2023-02-01 DIAGNOSIS — L603 Nail dystrophy: Secondary | ICD-10-CM

## 2023-02-01 DIAGNOSIS — Z940 Kidney transplant status: Secondary | ICD-10-CM | POA: Diagnosis not present

## 2023-02-01 MED ORDER — CLOBETASOL PROPIONATE 0.05 % EX OINT: 1.0000 | TOPICAL_OINTMENT | Freq: Two times a day (BID) | CUTANEOUS | 0 refills | Status: DC

## 2023-02-01 MED ORDER — TACROLIMUS 0.1 % EX OINT: TOPICAL_OINTMENT | Freq: Every day | CUTANEOUS | 0 refills | Status: DC

## 2023-02-01 NOTE — Progress Notes (Signed)
   New Patient Visit   Subjective  Carolyn Chang is a 52 y.o. female who presents for the following: new pt  Patient states she has dark marks located at the gluteal cleft that she would like to have examined. Patient reports the areas have been there for 6 months. She reports the areas are bothersome.Patient rates irritation 6 out of 10. She states that the areas have not spread. Patient reports she has previously been treated for these areas by PCP. She was Rx'd triamcinolone cream and helped but the area does not go away. Patient denies Hx of bx. Patient denies family history of skin cancer(s).  The patient has spots, moles and lesions to be evaluated, some may be new or changing and the patient may have concern these could be cancer.   The following portions of the chart were reviewed this encounter and updated as appropriate: medications, allergies, medical history  Review of Systems:  No other skin or systemic complaints except as noted in HPI or Assessment and Plan.  Objective  Well appearing patient in no apparent distress; mood and affect are within normal limits.  A focused examination was performed of the following areas:   Relevant exam findings are noted in the Assessment and Plan.          Assessment & Plan   Jesslin Herran presented with irritation and a white, scaly plaque in the intergluteal cleft, which appeared after performing a sit-up. She has been using triamcinolone cream with partial relief. The differential diagnosis includes psoriasis versus a warty plaque due to immunosuppression from her long-term tacrolimus use for a kidney transplant. The treatment plan includes clobetasol ointment, followed by tacrolimus ointment, and using fragrance-free water wipes. Follow-up is scheduled in three months to assess treatment response and consider a biopsy if there is no improvement.  1. 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 - Plan:   a. Prescribe clobetasol ointment, to be applied twice daily for two weeks.   b. Switch to tacrolimus ointment with a regimen of two weeks on, followed by two weeks off, for the next three months.   c. Advise the patient to use water wipes without fragrance for cleaning.   d. Schedule a follow-up in three months to evaluate the treatment response and consider a biopsy if there is no improvement.  2. History of Kidney Transplant and Immunosuppression - Assessment: Patient is currently on immunosuppressive therapy with tacrolimus. - Plan: Continue the current regimen of tacrolimus as prescribed by the transplant team. Monitor for potential complications related to immunosuppression, including warty plaques or infections.  3. Dry, Peeling Nails - Assessment: Patient presents with dry, peeling nails. - Plan: Monitor for any changes or worsening of the condition during follow-up visits. Encourage the patient to maintain good nail hygiene and moisturize regularly.    No follow-ups on file.    Documentation: I have reviewed the above documentation for accuracy and completeness, and I agree with the above.   I, Shirron Marcha Solders, CMA, am acting as scribe for [employer], DO.   Langston Reusing, DO

## 2023-02-01 NOTE — Patient Instructions (Addendum)
Hello Carolyn Chang,  Thank you for visiting [place] today. Your dedication to addressing your health concerns is greatly appreciated, and I am here to support you in improving your condition. Here is a summary of the key instructions from today's consultation:  - Medications Prescribed:   - Clobetasol Ointment: Apply twice a day for two weeks.   - Tacrolimus Ointment: Apply for two weeks on, followed by two weeks off. Continue this cycle for three months.  - Lifestyle Advice:   - Use water wipes without fragrance for gentle cleaning to avoid irritation.  - Follow-Up Appointment:   - We will see you again in three months to assess progress and discuss further treatment options.  - Additional Notes:   - Updated pictures have been taken today for our records.   - Both prescriptions will be sent to your pharmacy.  Thank you once again for your visit. I look forward to seeing you in three months to continue our care plan.  Warm regards,  Dr. Langston Reusing,  Dermatology Important Information  Due to recent changes in healthcare laws, you may see results of your pathology and/or laboratory studies on MyChart before the doctors have had a chance to review them. We understand that in some cases there may be results that are confusing or concerning to you. Please understand that not all results are received at the same time and often the doctors may need to interpret multiple results in order to provide you with the best plan of care or course of treatment. Therefore, we ask that you please give [place] 2 business days to thoroughly review all your results before contacting the office for clarification. Should we see a critical lab result, you will be contacted sooner.   If You Need Anything After Your Visit  If you have any questions or concerns for your doctor, please call our main line at [phone] If no one answers, please leave a voicemail as directed and we will return your call as soon as  possible. Messages left after 4 pm will be answered the following business day.   You may also send [place] a message via MyChart. We typically respond to MyChart messages within 1-2 business days.  For prescription refills, please ask your pharmacy to contact our office. Our fax number is [phone].  If you have an urgent issue when the clinic is closed that cannot wait until the next business day, you can page your doctor at the number below.    Please note that while we do our best to be available for urgent issues outside of office hours, we are not available 24/7.   If you have an urgent issue and are unable to reach [place], you may choose to seek medical care at your doctor's office, retail clinic, urgent care center, or emergency room.  If you have a medical emergency, please immediately call 911 or go to the emergency department. In the event of inclement weather, please call our main line at [phone] for an update on the status of any delays or closures.  Dermatology Medication Tips: Please keep the boxes that topical medications come in in order to help keep track of the instructions about where and how to use these. Pharmacies typically print the medication instructions only on the boxes and not directly on the medication tubes.   If your medication is too expensive, please contact our office at [phone] or send [place] a message through MyChart.   We are unable to tell  what your co-pay for medications will be in advance as this is different depending on your insurance coverage. However, we may be able to find a substitute medication at lower cost or fill out paperwork to get insurance to cover a needed medication.   If a prior authorization is required to get your medication covered by your insurance company, please allow [place] 1-2 business days to complete this process.  Drug prices often vary depending on where the prescription is filled and some pharmacies may offer cheaper  prices.  The website www.goodrx.com contains coupons for medications through different pharmacies. The prices here do not account for what the cost may be with help from insurance (it may be cheaper with your insurance), but the website can give you the price if you did not use any insurance.  - You can print the associated coupon and take it with your prescription to the pharmacy.  - You may also stop by our office during regular business hours and pick up a GoodRx coupon card.  - If you need your prescription sent electronically to a different pharmacy, notify our office through [facility] or by phone at [phone]

## 2023-02-02 ENCOUNTER — Encounter: Payer: Self-pay | Admitting: Dermatology

## 2023-02-03 NOTE — Telephone Encounter (Signed)
PA approved  Per CMM: Your PA request has been approved. Additional information will be provided in the approval communication. (Message 1145) Authorization Expiration Date: 05/05/2023

## 2023-02-03 NOTE — Telephone Encounter (Signed)
PA initiated [facility] Key: [id number]

## 2023-03-08 ENCOUNTER — Ambulatory Visit: Payer: No Typology Code available for payment source | Admitting: Psychiatry

## 2023-03-15 ENCOUNTER — Other Ambulatory Visit: Payer: Self-pay | Admitting: Psychiatry

## 2023-03-15 DIAGNOSIS — F319 Bipolar disorder, unspecified: Secondary | ICD-10-CM

## 2023-05-03 ENCOUNTER — Encounter: Payer: Self-pay | Admitting: Dermatology

## 2023-05-04 ENCOUNTER — Ambulatory Visit: Payer: No Typology Code available for payment source | Admitting: Dermatology

## 2023-05-10 ENCOUNTER — Telehealth: Payer: Self-pay | Admitting: Psychiatry

## 2023-05-10 ENCOUNTER — Ambulatory Visit: Payer: No Typology Code available for payment source | Admitting: Psychiatry

## 2023-05-10 ENCOUNTER — Encounter: Payer: Self-pay | Admitting: Psychiatry

## 2023-05-10 DIAGNOSIS — R7989 Other specified abnormal findings of blood chemistry: Secondary | ICD-10-CM

## 2023-05-10 DIAGNOSIS — F319 Bipolar disorder, unspecified: Secondary | ICD-10-CM

## 2023-05-10 MED ORDER — DIVALPROEX SODIUM ER 500 MG PO TB24
1000.0000 mg | ORAL_TABLET | Freq: Every day | ORAL | 3 refills | Status: AC
Start: 2023-05-10 — End: ?

## 2023-05-10 NOTE — Progress Notes (Signed)
Carolyn Chang [id number] 26-Oct-1970 53 y.o.   Subjective:   Patient ID:  Carolyn Chang is a 53 y.o. (DOB 03/05/1971) female.  Chief Complaint:  Chief Complaint  Patient presents with   Follow-up    HPI Carolyn Chang presents for follow-up of bipolar disorder.  seen August 2019.  She was stable at the time and no meds were changed.  She had had an episode of mania after a colon cleanse but that resolved early 2019.  Otherwise stable for years.  03/05/20 appt with following noted: Bored working from home.   Vaccinated.   H not ready to go out yet. Had Gastric sleeve Feb 2021 and lost from 254# to 194#. No problems with Depakote ER 1000 mg HS and is consistent.  03/05/21 appt noted: Only psych med is Depakote ER 1000 mg nightly and is consistent with it. No side effect Work from home but got Covid once in July after travel.  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  03/05/22 appt noted: Only psych med is Depakote ER 1000 mg nightly and is consistent with it. No SE.  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   Doing well.   Working from home and going OK.  Likes working from home.  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  Patient reports stable mood and denies depressed or irritable moods.  No mania.  Patient denies any recent difficulty with anxiety.  Patient denies difficulty with sleep initiation or maintenance 5-7 hours. Sometimes to the gym. Denies appetite disturbance.  Patient reports that energy and motivation have been good.  Patient denies any difficulty with concentration.  Patient denies any suicidal ideation. D going with her to [place] where she lives.   Plan no changes  05/10/23 appt noted: Only psych med is Depakote ER 1000 mg nightly and is consistent with it. Health stable and kidney transplant working well. No SE Doing well from mental health perspective.  No concerns with meds.  No sig mood swings.   Sleep good.   Started different job with promotion.  Working [employer].  Still working from home and likes it.  Had G sleeve and Wegovy.  Started 265 and now 194#.  Has started at gym.   M 53 years. She's from [place].  D is here.   Past Psychiatric Medication Trials: Depakote ER 1000 mg since under our care July 2003. Haldol side effects no history of antidepressants.   Diagnosed bipolar disorder 1995. History lithium toxicity  Review of Systems:  Review of Systems  Gastrointestinal:  Negative for abdominal pain and nausea.  Neurological:  Negative for tremors.  Psychiatric/Behavioral:  Negative for dysphoric mood. The patient is not nervous/anxious.     Medications: I have reviewed the patient's current medications.  Current Outpatient Medications  Medication Sig Dispense Refill   aspirin 81 MG tablet Take 81 mg by mouth daily.     atorvastatin (LIPITOR) 10 MG tablet Take 1 tablet (10 mg total) by mouth at bedtime. 90 tablet 3   Calcium Acetate, Phos Binder, (CALCIUM ACETATE PO) Take by mouth.     clobetasol ointment (TEMOVATE) 0.05 % Apply 1 Application topically 2 (two) times daily. 30 g 0   ferrous sulfate 325 (65 FE) MG tablet Take 325 mg by mouth daily with breakfast.     influenza vaccine (FLUCELVAX QUADRIVALENT) 0.5 ML injection Flucelvax Quad 2019-2020 (PF) 60 mcg (15 mcg x 4)/0.5 mL IM syringe  TO  BE ADMINISTERED BY PHARMACIST FOR IMMUNIZATION     labetalol (NORMODYNE) 100 MG tablet Take 200 mg by mouth 2 (two) times daily.     Magnesium 400 MG CAPS Take 1 capsule by mouth. Reported on 04/06/2015     Multiple Vitamin (MULTIVITAMIN) capsule Take 1 capsule by mouth daily.     mupirocin ointment (BACTROBAN) 2 %      MYFORTIC 180 MG EC tablet Take by mouth.     omeprazole (PRILOSEC) 20 MG capsule Take 20 mg by mouth daily.     tacrolimus (PROGRAF) 1 MG capsule Take 1 mg by mouth 2 (two) times daily.     tacrolimus (PROTOPIC) 0.1 % ointment Apply topically daily. 100 g 0   torsemide (DEMADEX) 20 MG  tablet TAKE 1 TABLET BY MOUTH EVERY DAY AS NEEDED 90 tablet 1   Zinc Acetate, Oral, (ZINC ACETATE PO) Take by mouth.     divalproex (DEPAKOTE ER) 500 MG 24 hr tablet Take 2 tablets (1,000 mg total) by mouth at bedtime. 180 tablet 3   No current facility-administered medications for this visit.    Medication Side Effects: None  Allergies: No Known Allergies  Past Medical History:  Diagnosis Date   Bipolar 2 disorder (HCC)    Chronic kidney disease    Seconday to Lithium Treatment    Hyperlipidemia    Hypertension    Obesity    Sleep apnea    CPAP   Transplantation of kidney as the cause of abnormal reaction of patient, or of later complication, without misadventure at the time of operation 07/14/2018    Family History  Problem Relation Age of Onset   Alcohol abuse Father    Arthritis Father    Cirrhosis Father    Birth defects Sister        malformation of lungs   Lung disease Sister    Diabetes Maternal Grandmother     Social History   Socioeconomic History   Marital status: Married    Spouse name: Carolyn Chang    Number of children: 1   Years of education: 16   Highest education level: Not on file  Occupational History   Occupation: CUSTOMER SERVICE    Employer: [employer]    Comment: [employer]  Tobacco Use   Smoking status: Never   Smokeless tobacco: Never  Substance and Sexual Activity   Alcohol use: Yes    Alcohol/week: 1.0 standard drink of alcohol    Types: 1 Glasses of wine per week   Drug use: No   Sexual activity: Yes    Partners: Male    Birth control/protection: None  Other Topics Concern   Not on file  Social History Narrative   Marital Status:  Married Carolyn Chang)   Children:  Daughter Carolyn Chang)    Pets: None    Living Situation: Lives with husband and daughter.   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[employer])    Education: Oncologist    Tobacco Use/Exposure:  None    Alcohol Use:   Occasional   Drug Use:  None   Diet:  Regular   Exercise:  Four times weekly    Hobbies: Reading                   Social Drivers of Corporate investment banker Strain: Not on file  Food Insecurity: Not on file  Transportation Needs: Not on file  Physical Activity: Not on file  Stress: Not on  file  Social Connections: Unknown (08/05/2021)   Received from [facility]   Social Network    Social Network: Not on file  Intimate Partner Violence: Unknown (06/27/2021)   Received from [facility], [facility]   HITS    Physically Hurt: Not on file    Insult or Talk Down To: Not on file    Threaten Physical Harm: Not on file    Scream or Curse: Not on file    Past Medical History, Surgical history, Social history, and Family history were reviewed and updated as appropriate.   Please see review of systems for further details on the patient's review from today.   Objective:   Physical Exam:  There were no vitals taken for this visit.  Physical Exam Constitutional:      General: She is not in acute distress. Musculoskeletal:        General: No deformity.  Neurological:     Mental Status: She is alert and oriented to person, place, and time.     Cranial Nerves: No dysarthria.     Coordination: Coordination normal.  Psychiatric:        Attention and Perception: Attention and perception normal. She does not perceive auditory or visual hallucinations.        Mood and Affect: Mood normal. Mood is not anxious or depressed. Affect is not labile or angry.        Speech: Speech normal.        Behavior: Behavior normal. Behavior is cooperative.        Thought Content: Thought content normal. Thought content is not paranoid or delusional. Thought content does not include homicidal or suicidal ideation.        Cognition and Memory: Cognition and memory normal.        Judgment: Judgment normal.     Comments: Insight good.     Lab Review:     Component Value  Date/Time   NA 140 02/17/2022 0000   K 3.8 02/17/2022 0000   CL 104 02/17/2022 0000   CO2 24 12/25/2010 0949   GLUCOSE 76 12/25/2010 0949   BUN 14 02/17/2022 0000   CREATININE 0.9 02/17/2022 0000   CREATININE 4.34 (H) 12/25/2010 0949   CALCIUM 9.5 02/17/2022 0000   ALBUMIN 3.7 02/17/2022 0000   AST 29 02/17/2022 0000   ALT 20 02/17/2022 0000   ALKPHOS 75 02/17/2022 0000   GFRNONAA 12 (L) 12/25/2010 0949   GFRAA 14 (L) 12/25/2010 0949       Component Value Date/Time   WBC 5.2 02/17/2022 0000   WBC 6.0 12/25/2010 0949   RBC 3.65 (A) 02/17/2022 0000   HGB 11.9 (A) 02/17/2022 0000   HCT 34 (A) 02/17/2022 0000   PLT 191 02/17/2022 0000   MCV 97.0 12/25/2010 0949   MCH 30.4 12/25/2010 0949   MCHC 31.3 12/25/2010 0949   RDW 12.9 12/25/2010 0949    No results found for: "POCLITH", "LITHIUM"   No results found for: "PHENYTOIN", "PHENOBARB", "VALPROATE", "CBMZ"   .res Assessment: Plan:    Santos was seen today for follow-up.  Diagnoses and all orders for this visit:  Bipolar I disorder (HCC) -     divalproex (DEPAKOTE ER) 500 MG 24 hr tablet; Take 2 tablets (1,000 mg total) by mouth at bedtime. -     Valproic acid level -     Comprehensive metabolic panel  Low vitamin D level   30 min appt  We discussed the issues  noted below   As noted she did have a manic episode last year when using a colon cleanse product but it resolved with discontinuation of that product. Continued stability with meds.  Had gastric sleeve possibility and absorption of Depakote ER.  Should be OK as long as not doing gastric bypass which would be a problem.  No psychiatric  Contraindication for this procedure and she's lost 65#.  No med change indicated.  Continue Depakote ER 1000 mg daily as the only psychiatric medication.  Discussed side effect possibilities with Depakote including rash and liver toxicity as well as more common side effects such as balance problems and fatigue.  She is done  well with that.  Answered questions about work accommodations.  Had to work long hours in the past and hard to do that and keep going to gym necessary for her health.  Asks for accomodation to limit work to M-F with hours no later than 6 pm.   She wants to continue working from home which she is doing currently.  She remains on immunosuppressants for her kidney transplant and wants to limit contact with public when possible. Disc FMLA forms.    Consider checking level but stability suggests no change indicated. Yes and CMP.  Not done in some time.   FU 1 year bc stable  Meredith Staggers, MD, DFAPA   No future appointments.  Orders Placed This Encounter  Procedures   Valproic acid level   Comprehensive metabolic panel      -------------------------------

## 2023-05-10 NOTE — Telephone Encounter (Signed)
Received FMLA forms. Given to [facility] 2/17

## 2023-05-13 DIAGNOSIS — Z0289 Encounter for other administrative examinations: Secondary | ICD-10-CM

## 2023-05-13 NOTE — Telephone Encounter (Signed)
Forms completed and will have Dr. Jennelle Human review and sign

## 2023-05-16 ENCOUNTER — Other Ambulatory Visit: Payer: Self-pay | Admitting: Nurse Practitioner

## 2023-05-16 DIAGNOSIS — E782 Mixed hyperlipidemia: Secondary | ICD-10-CM

## 2023-05-17 NOTE — Telephone Encounter (Signed)
 Called and left a voice message asking to give the office a call back

## 2023-05-25 ENCOUNTER — Telehealth: Payer: Self-pay | Admitting: Psychiatry

## 2023-05-25 NOTE — Telephone Encounter (Signed)
 Called pt to advise paperwork is completed. Need pt portion completed. Pt will come in office 3/5 to complete her part and we can fax.   Paperwork at front desk

## 2023-06-28 ENCOUNTER — Other Ambulatory Visit: Payer: Self-pay | Admitting: Nurse Practitioner

## 2023-06-28 DIAGNOSIS — E782 Mixed hyperlipidemia: Secondary | ICD-10-CM

## 2023-08-06 LAB — COMPLETE METABOLIC PANEL WITHOUT GFR
AG Ratio: 1.3 (calc) (ref 1.0–2.5)
ALT: 12 U/L (ref 6–29)
AST: 18 U/L (ref 10–35)
Albumin: 4 g/dL (ref 3.6–5.1)
Alkaline phosphatase (APISO): 70 U/L (ref 37–153)
BUN/Creatinine Ratio: 16 (calc) (ref 6–22)
BUN: 17 mg/dL (ref 7–25)
CO2: 28 mmol/L (ref 20–32)
Calcium: 9.7 mg/dL (ref 8.6–10.4)
Chloride: 101 mmol/L (ref 98–110)
Creat: 1.07 mg/dL — ABNORMAL HIGH (ref 0.50–1.03)
Globulin: 3.2 g/dL (ref 1.9–3.7)
Glucose, Bld: 85 mg/dL (ref 65–99)
Potassium: 3.9 mmol/L (ref 3.5–5.3)
Sodium: 138 mmol/L (ref 135–146)
Total Bilirubin: 0.3 mg/dL (ref 0.2–1.2)
Total Protein: 7.2 g/dL (ref 6.1–8.1)

## 2023-08-06 LAB — VALPROIC ACID LEVEL: Valproic Acid Lvl: 98.1 mg/L (ref 50.0–100.0)

## 2023-08-31 ENCOUNTER — Encounter: Payer: Self-pay | Admitting: Dermatology

## 2023-08-31 ENCOUNTER — Ambulatory Visit: Admitting: Dermatology

## 2023-08-31 VITALS — BP 166/105 | HR 87

## 2023-08-31 DIAGNOSIS — D045 Carcinoma in situ of skin of trunk: Secondary | ICD-10-CM

## 2023-08-31 DIAGNOSIS — D485 Neoplasm of uncertain behavior of skin: Secondary | ICD-10-CM

## 2023-08-31 DIAGNOSIS — B079 Viral wart, unspecified: Secondary | ICD-10-CM | POA: Diagnosis not present

## 2023-08-31 DIAGNOSIS — Z13228 Encounter for screening for other metabolic disorders: Secondary | ICD-10-CM | POA: Insufficient documentation

## 2023-08-31 MED ORDER — MUPIROCIN 2 % EX OINT: TOPICAL_OINTMENT | Freq: Two times a day (BID) | CUTANEOUS | 0 refills | Status: DC

## 2023-08-31 NOTE — Progress Notes (Signed)
   Follow-Up Visit   Subjective  Carolyn Chang is a 53 y.o. female who presents for the following:  Patient present today for follow up visit for Psoriasis. Patient was last evaluated on 02/01/23. At this visit patient was prescribed clobetasol  and Tacrolimus  to alternate 2 weeks on and 2 weeks off. Patient reports sxs are not at goal. Patient denies medication changes.  The following portions of the chart were reviewed this encounter and updated as appropriate: medications, allergies, medical history  Review of Systems:  No other skin or systemic complaints except as noted in HPI or Assessment and Plan.  Objective  Well appearing patient in no apparent distress; mood and affect are within normal limits.  A focused examination was performed of the following areas: Gluteal Crease  Relevant exam findings are noted in the Assessment and Plan.    Gluteal Crease 1 cm Verrucous papules   Assessment & Plan   WART Exam: verrucous papule(s)  Counseling Discussed viral / HPV (Human Papilloma Virus) etiology and risk of spread /infectivity to other areas of body as well as to other people.  Multiple treatments and methods may be required to clear warts and it is possible treatment may not be successful.  Treatment risks include discoloration; scarring and there is still potential for wart recurrence.  Treatment Plan: - Bx completed while in office today - Prescription for Mupirocin sent to apply 2 times daily until area healed - Follow up in 3 Months to re-asses  NEOPLASM OF UNCERTAIN BEHAVIOR OF SKIN Gluteal Crease Epidermal / dermal shaving  Lesion diameter (cm):  1 Informed consent: discussed and consent obtained   Timeout: patient name, date of birth, surgical site, and procedure verified   Procedure prep:  Patient was prepped and draped in usual sterile fashion Prep type:  Isopropyl alcohol Anesthesia: the lesion was anesthetized in a standard fashion   Anesthetic:  1%  lidocaine  w/ epinephrine 1-100,000 buffered w/ 8.4% NaHCO3 Instrument used: flexible razor blade   Hemostasis achieved with: pressure, aluminum chloride and electrodesiccation   Outcome: patient tolerated procedure well   Post-procedure details: sterile dressing applied and wound care instructions given   Dressing type: bandage and petrolatum   Specimen 1 - Surgical pathology Differential Diagnosis: R/O Wart  Check Margins: No  Return in about 3 months (around 12/01/2023) for Wart F/U.  I, Jetta Ager, am acting as Neurosurgeon for [employer], DO.  Documentation: I have reviewed the above documentation for accuracy and completeness, and I agree with the above.  Louana Roup, DO

## 2023-08-31 NOTE — Patient Instructions (Addendum)

## 2023-09-02 LAB — SURGICAL PATHOLOGY

## 2023-09-02 NOTE — Telephone Encounter (Signed)
 Called patient and left a voice message per DPR stating that she will need to give the office a call at [phone] to get scheduled for an appointment to be evaluated for her blood pressure.

## 2023-09-08 ENCOUNTER — Encounter: Payer: Self-pay | Admitting: Dermatology

## 2023-09-08 ENCOUNTER — Other Ambulatory Visit: Payer: Self-pay | Admitting: Nurse Practitioner

## 2023-09-08 ENCOUNTER — Telehealth: Payer: Self-pay

## 2023-09-08 DIAGNOSIS — E782 Mixed hyperlipidemia: Secondary | ICD-10-CM

## 2023-09-08 NOTE — Telephone Encounter (Signed)
 Left a detailed message for patient to call the office back and schedule an appointment.

## 2023-09-08 NOTE — Telephone Encounter (Signed)
 Copied from CRM [id number]. Topic: Clinical - Medication Refill >> Sep 08, 2023  3:52 PM Dorisann Garre T wrote: Medication: omeprazole (PRILOSEC) 20 MG capsule [atorvastatin  (LIPITOR) 10 MG tablet [  Has the patient contacted their pharmacy? Yes (Agent: If no, request that the patient contact the pharmacy for the refill. If patient does not wish to contact the pharmacy document the reason why and proceed with request.) (Agent: If yes, when and what did the pharmacy advise?)  This is the patient's preferred pharmacy:  [id number] [place], [place] - [address] [place] [address] Phone: [phone] Fax: [phone] Is this the correct pharmacy for this prescription? Yes If no, delete pharmacy and type the correct one.   Has the prescription been filled recently? No  Is the patient out of the medication? Yes  Has the patient been seen for an appointment in the last year OR does the patient have an upcoming appointment? Yes  Can we respond through MyChart? Yes  Agent: Please be advised that Rx refills may take up to 3 business days. We ask that you follow-up with your pharmacy.

## 2023-09-08 NOTE — Telephone Encounter (Signed)
 Copied from CRM [id number]. Topic: General - Other >> Sep 08, 2023  3:56 PM Carolyn Chang wrote: Reason for CRM: patient is calling needing a appt then what is available

## 2023-09-09 ENCOUNTER — Ambulatory Visit: Payer: Self-pay | Admitting: Dermatology

## 2023-09-09 ENCOUNTER — Encounter: Payer: Self-pay | Admitting: Nurse Practitioner

## 2023-09-09 ENCOUNTER — Other Ambulatory Visit: Payer: Self-pay | Admitting: Nurse Practitioner

## 2023-09-09 ENCOUNTER — Encounter: Payer: Self-pay | Admitting: Dermatology

## 2023-09-09 DIAGNOSIS — E782 Mixed hyperlipidemia: Secondary | ICD-10-CM

## 2023-09-09 DIAGNOSIS — D099 Carcinoma in situ, unspecified: Secondary | ICD-10-CM | POA: Insufficient documentation

## 2023-09-09 NOTE — Telephone Encounter (Signed)
 Requesting: omeprazole (PRILOSEC) 20 MG capsule , atorvastatin  (LIPITOR) 10 MG tablet  Last Visit: Visit date not found Next Visit: 09/09/2023 Last Refill: 12/27/2020, 05/17/2023  Please Advise   Left message to call the office and schedule an appointment for Rx refills

## 2023-09-09 NOTE — Telephone Encounter (Signed)
 Copied from CRM [id number]. Topic: Clinical - Medication Refill >> Sep 09, 2023  8:34 AM Earnestine Goes B wrote: Medication: atorvastatin  (LIPITOR) 10 MG tablet , omeprazole (PRILOSEC) 20 MG capsule  Has the patient contacted their pharmacy? Yes (Agent: If no, request that the patient contact the pharmacy for the refill. If patient does not wish to contact the pharmacy document the reason why and proceed with request.) (Agent: If yes, when and what did the pharmacy advise?)    [id number] [place], [place] - [address] [place] [address] Phone: [phone] Fax: [phone]  Is this the correct pharmacy for this prescription? Yes If no, delete pharmacy and type the correct one.   Has the prescription been filled recently? Yes  Is the patient out of the medication? Yes  Has the patient been seen for an appointment in the last year OR does the patient have an upcoming appointment? Yes  Can we respond through MyChart? Yes  Agent: Please be advised that Rx refills may take up to 3 business days. We ask that you follow-up with your pharmacy.

## 2023-09-09 NOTE — Telephone Encounter (Signed)
 Carolyn Chang   09/09/2023  9:52 AM  Type: General  LVM to notify pt Carolyn Chang is out of office and returns on 6/24. We will follow up with her regarding medication requesting after Carolyn Chang has time to review the request.

## 2023-09-09 NOTE — Telephone Encounter (Signed)
 Error/Investigation Details: Patient called in wanting to schedule an appointment for her physical and was wanting her PCP to start sending in two of her medications: omeprazole (PRILOSEC) 20 MG capsule and atorvastatin  (LIPITOR) 10 MG tablet. Per patient her Nephrologist was writing the prescription, but she is wanting her PCP to write all of her prescriptions. Patient was wanting a physical in July, the first available physical with PCP is 11/05/23. The specialist advised that she would send over a message to see if she could be seen sooner that 11/05/2023. The patient stated ok, and that if she has to wait until August that is fine, but her main concern is being able to get her prescriptions sent in by her PCP. Agent should have scheduled the physical for 11/05/23, put the patient on a wait list, and should have sent over a detailed CRM letting the office know that the patient is scheduled on 11/05/23 for her physical but was wanting to see if she can be worked in with PCP in July due to wanting her medications to be sent in by her PCP.

## 2023-09-09 NOTE — Progress Notes (Signed)
 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  It was Likely a wart that transformed into a superficial skin cancer.  It's very treatable with an additional surgical excision to ensure full removal.  I will refer to Dr. Paci for a surgical excision.  Thanks :)

## 2023-09-10 MED ORDER — OMEPRAZOLE 20 MG PO CPDR: 20.0000 mg | DELAYED_RELEASE_CAPSULE | Freq: Every day | ORAL | 0 refills | Status: DC

## 2023-09-10 MED ORDER — ATORVASTATIN CALCIUM 10 MG PO TABS: 10.0000 mg | ORAL_TABLET | Freq: Every day | ORAL | 0 refills | Status: DC

## 2023-09-10 NOTE — Telephone Encounter (Signed)
 I called patient and left a message that prescriptions were refilled for a 30 day supply and to keep appointment with [facility] on 09/22/23.

## 2023-09-10 NOTE — Telephone Encounter (Signed)
 Copied from CRM [id number]. Topic: Clinical - Medication Question >> Sep 09, 2023 11:19 AM Adonis Hoot wrote: Reason for CRM: Patient called in stating that she keeps getting a call from [facility] to make an appointment for medication  refill,she has an appointment schedule for [place] next available which was July 2nd,so she would like to know is she able to get her medications filled to last he until her appointment? She said that a mychart message would be fine to reach her by,or if she gets another call could a VM be left please to let her know what she needs to do?

## 2023-09-10 NOTE — Addendum Note (Signed)
 Addended by: Kagan Hietpas A on: 09/10/2023 09:09 AM   Modules accepted: Orders

## 2023-09-22 ENCOUNTER — Ambulatory Visit: Admitting: Nurse Practitioner

## 2023-09-22 VITALS — BP 134/82 | HR 96 | Temp 98.6°F | Ht 63.0 in | Wt 198.4 lb

## 2023-09-22 DIAGNOSIS — N1831 Chronic kidney disease, stage 3a: Secondary | ICD-10-CM

## 2023-09-22 DIAGNOSIS — E559 Vitamin D deficiency, unspecified: Secondary | ICD-10-CM

## 2023-09-22 DIAGNOSIS — E782 Mixed hyperlipidemia: Secondary | ICD-10-CM

## 2023-09-22 DIAGNOSIS — R600 Localized edema: Secondary | ICD-10-CM

## 2023-09-22 DIAGNOSIS — I150 Renovascular hypertension: Secondary | ICD-10-CM | POA: Diagnosis not present

## 2023-09-22 DIAGNOSIS — D631 Anemia in chronic kidney disease: Secondary | ICD-10-CM

## 2023-09-22 NOTE — Assessment & Plan Note (Signed)
 Repeat cbc

## 2023-09-22 NOTE — Progress Notes (Signed)
 Established Patient Visit  Patient: Carolyn Chang   DOB: [birth year]   53 y.o. Female  MRN: [id number] Visit Date: 09/22/2023  Subjective:    Chief Complaint  Patient presents with   Follow-up    MEDICATION REFILLS NEEDED AND OVERDUE FOR FOLLOW UP LAST SEEN IN 2023    HPI Renovascular hypertension Managed by Dr. Morene Sorrow (nephrology). Current use of labetalol BID S/p kidney transplant x1, use of prograf  and myfortic Managed by [facility] Reviewed labs results on patient's mobile device: CMP (stable renal function) BP Readings from Last 3 Encounters:  09/22/23 134/82  08/31/23 (!) 166/105  01/19/22 136/82   Continue f/up with nephrology and [facility] Check CBC, vit. D, and TSH  Anemia in chronic kidney disease Repeat cbc  Mixed hyperlipidemia Repeat lipid panel and THYROID Advised about need for mediterranean diet Maintain atorvastatin  dose  Bilateral leg edema Chronic, waxing and waning, no SOB or PND or CP No known hx of CHF Use of torsemide  prn mild Le edema present at this visit (L>R) Consider echocardiogram if any change  Plans to complete mammogram and PAP smear with Dr. Evangeline (GYN)  Reviewed medical, surgical, and social history today  Medications: Outpatient Medications Prior to Visit  Medication Sig   aspirin 81 MG tablet Take 81 mg by mouth daily.   atorvastatin  (LIPITOR) 10 MG tablet Take 1 tablet (10 mg total) by mouth daily.   Calcium  Acetate, Phos Binder, (CALCIUM  ACETATE PO) Take by mouth.   clobetasol  ointment (TEMOVATE ) 0.05 % Apply 1 Application topically 2 (two) times daily.   divalproex  (DEPAKOTE  ER) 500 MG 24 hr tablet Take 2 tablets (1,000 mg total) by mouth at bedtime.   ferrous sulfate 325 (65 FE) MG tablet Take 325 mg by mouth daily with breakfast.   labetalol (NORMODYNE) 100 MG tablet Take 200 mg by mouth 2 (two) times daily.   Magnesium 400 MG CAPS Take 1 capsule by mouth. Reported  on 04/06/2015   Multiple Vitamin (MULTIVITAMIN) capsule Take 1 capsule by mouth daily.   mupirocin  ointment (BACTROBAN ) 2 % Apply topically 2 (two) times daily.   MYFORTIC 180 MG EC tablet Take by mouth.   omeprazole  (PRILOSEC) 20 MG capsule Take 1 capsule (20 mg total) by mouth daily.   sulfamethoxazole-trimethoprim (BACTRIM) 400-80 MG tablet Take 1 tablet by mouth 3 (three) times a week.   tacrolimus  (PROGRAF ) 1 MG capsule Take 1 mg by mouth 2 (two) times daily.   tacrolimus  (PROTOPIC ) 0.1 % ointment Apply topically daily.   torsemide  (DEMADEX ) 20 MG tablet TAKE 1 TABLET BY MOUTH EVERY DAY AS NEEDED   WEGOVY  2.4 MG/0.75ML SOAJ SMARTSIG:2.4 Milligram(s) SUB-Q Once a Week   Zinc Acetate, Oral, (ZINC ACETATE PO) Take by mouth.   influenza vaccine (FLUCELVAX QUADRIVALENT) 0.5 ML injection Flucelvax Quad 2019-2020 (PF) 60 mcg (15 mcg x 4)/0.5 mL IM syringe  TO BE ADMINISTERED BY PHARMACIST FOR IMMUNIZATION   No facility-administered medications prior to visit.   Reviewed past medical and social history.   ROS per HPI above      Objective:  BP 134/82 (BP Location: Left Arm, Patient Position: Sitting, Cuff Size: Normal)   Pulse 96   Temp 98.6 F (37 C) (Oral)   Ht 5' 3 (1.6 m)   Wt 198 lb 6.4 oz (90 kg)   LMP 03/22/2015   SpO2 97%   BMI 35.14 kg/m  Physical Exam Vitals and nursing note reviewed.  Cardiovascular:     Rate and Rhythm: Normal rate and regular rhythm.     Pulses: Normal pulses.     Heart sounds: Normal heart sounds.  Pulmonary:     Effort: Pulmonary effort is normal.     Breath sounds: Normal breath sounds.  Musculoskeletal:        General: No tenderness.     Right lower leg: No edema.     Left lower leg: Edema present.  Skin:    General: Skin is warm and dry.     Findings: No erythema.  Neurological:     Mental Status: She is alert and oriented to person, place, and time.     No results found for any visits on 09/22/23.    Assessment & Plan:     Problem List Items Addressed This Visit     Anemia in chronic kidney disease   Repeat cbc      Relevant Orders   CBC with Differential/Platelet   Bilateral leg edema   Chronic, waxing and waning, no SOB or PND or CP No known hx of CHF Use of torsemide  prn mild Le edema present at this visit (L>R) Consider echocardiogram if any change      Mixed hyperlipidemia   Repeat lipid panel and THYROID Advised about need for mediterranean diet Maintain atorvastatin  dose      Relevant Orders   Lipid panel   TSH   Renovascular hypertension - Primary   Managed by Dr. Morene Sorrow (nephrology). Current use of labetalol BID S/p kidney transplant x1, use of prograf  and myfortic Managed by [facility] Reviewed labs results on patient's mobile device: CMP (stable renal function) BP Readings from Last 3 Encounters:  09/22/23 134/82  08/31/23 (!) 166/105  01/19/22 136/82   Continue f/up with nephrology and [facility] Check CBC, vit. D, and TSH      Vitamin D  deficiency   Relevant Orders   VITAMIN D  25 Hydroxy (Vit-D Deficiency, Fractures)   Return in about 6 months (around 03/24/2024) for HTN, hyperlipidemia (fasting).     Roselie Mood, NP

## 2023-09-22 NOTE — Assessment & Plan Note (Signed)
 Repeat lipid panel and THYROID Advised about need for mediterranean diet Maintain atorvastatin  dose

## 2023-09-22 NOTE — Patient Instructions (Addendum)
 Schedule fasting lab appt. Need to be fasting 8hrs prior to blood draw. Ok to drink water and take BP meds. Maintain Heart healthy diet and daily exercise. Maintain current medications. Have mammogram and PAP results faxed to me once completed  Mediterranean Diet A Mediterranean diet is based on the traditions of countries on the [employer]. It focuses on eating more: Fruits and vegetables. Whole grains, beans, nuts, and seeds. Heart-healthy fats. These are fats that are good for your heart. It involves eating less: Dairy. Meat and eggs. Processed foods with added sugar, salt, and fat. This type of diet can help prevent certain conditions. It can also improve outcomes if you have a long-term (chronic) disease, such as kidney or heart disease. What are tips for following this plan? Reading food labels Check packaged foods for: The serving size. For foods such as rice and pasta, the serving size is the amount of cooked product, not dry. The total fat. Avoid foods with saturated fat or trans fat. Added sugars, such as corn syrup. Shopping  Try to have a balanced diet. Buy a variety of foods, such as: Fresh fruits and vegetables. You may be able to get these from local farmers markets. You can also buy them frozen. Grains, beans, nuts, and seeds. Some of these can be bought in bulk. Fresh seafood. Poultry and eggs. Low-fat dairy products. Buy whole ingredients instead of foods that have already been packaged. If you can't get fresh seafood, buy precooked frozen shrimp or canned fish, such as tuna, salmon, or sardines. Stock your pantry so you always have certain foods on hand, such as olive oil, canned tuna, canned tomatoes, rice, pasta, and beans. Cooking Cook foods with extra-virgin olive oil instead of using butter or other vegetable oils. Have meat as a side dish. Have vegetables or grains as your main dish. This means having meat in small portions or adding small amounts of  meat to foods like pasta or stew. Use beans or vegetables instead of meat in common dishes like chili or lasagna. Try out different cooking methods. Try roasting, broiling, steaming, and sauting vegetables. Add frozen vegetables to soups, stews, pasta, or rice. Add nuts or seeds for added healthy fats and plant protein at each meal. You can add these to yogurt, salads, or vegetable dishes. Marinate fish or vegetables using olive oil, lemon juice, garlic, and fresh herbs. Meal planning Plan to eat a vegetarian meal one day each week. Try to work up to two vegetarian meals, if possible. Eat seafood two or more times a week. Have healthy snacks on hand. These may include: Vegetable sticks with hummus. Greek yogurt. Fruit and nut trail mix. Eat balanced meals. These should include: Fruit: 2-3 servings a day. Vegetables: 4-5 servings a day. Low-fat dairy: 2 servings a day. Fish, poultry, or lean meat: 1 serving a day. Beans and legumes: 2 or more servings a week. Nuts and seeds: 1-2 servings a day. Whole grains: 6-8 servings a day. Extra-virgin olive oil: 3-4 servings a day. Limit red meat and sweets to just a few servings a month. Lifestyle  Try to cook and eat meals with your family. Drink enough fluid to keep your pee (urine) pale yellow. Be active every day. This includes: Aerobic exercise, which is exercise that causes your heart to beat faster. Examples include running and swimming. Leisure activities like gardening, walking, or housework. Get 7-8 hours of sleep each night. Drink red wine if your provider says you can. A glass of  wine is 5 oz (150 mL). You may be allowed to have: Up to 1 glass a day if you're female and not pregnant. Up to 2 glasses a day if you're female. What foods should I eat? Fruits Apples. Apricots. Avocado. Berries. Bananas. Cherries. Dates. Figs. Grapes. Lemons. Melon. Oranges. Peaches. Plums. Pomegranate. Vegetables Artichokes. Beets. Broccoli. Cabbage.  Carrots. Eggplant. Green beans. Chard. Kale. Spinach. Onions. Leeks. Peas. Squash. Tomatoes. Peppers. Radishes. Grains Whole-grain pasta. Brown rice. Bulgur wheat. Polenta. Couscous. Whole-wheat bread. [place] Madeira. Meats and other proteins Beans. Almonds. Sunflower seeds. Pine nuts. Peanuts. Cod. Salmon. Scallops. Shrimp. Tuna. Tilapia. Clams. Oysters. Eggs. Chicken or [place] without skin. Dairy Low-fat milk. Cheese. Greek yogurt. Fats and oils Extra-virgin olive oil. Avocado oil. Grapeseed oil. Beverages Water. Red wine. Herbal tea. Sweets and desserts Greek yogurt with honey. Baked apples. Poached pears. Trail mix. Seasonings and condiments Basil. Cilantro. Coriander. Cumin. Mint. Parsley. Sage. Rosemary. Tarragon. Garlic. Oregano. Thyme. Pepper. Balsamic vinegar. Tahini. Hummus. Tomato sauce. Olives. Mushrooms. The items listed above may not be all the foods and drinks you can have. Talk to a dietitian to learn more. What foods should I limit? This is a list of foods that should be eaten rarely. Fruits Fruit canned in syrup. Vegetables Deep-fried potatoes, like [place] fries. Grains Packaged pasta or rice dishes. Cereal with added sugar. Snacks with added sugar. Meats and other proteins Beef. Pork. Lamb. Chicken or [place] with skin. Hot dogs. [place]. Dairy Ice cream. Sour cream. Whole milk. Fats and oils Butter. Canola oil. Vegetable oil. Beef fat (tallow). Lard. Beverages Juice. Sugar-sweetened soft drinks. Beer. Liquor and spirits. Sweets and desserts Cookies. Cakes. Pies. Candy. Seasonings and condiments Mayonnaise. Pre-made sauces and marinades. The items listed above may not be all the foods and drinks you should limit. Talk to a dietitian to learn more. Where to find more information American Heart Association (AHA): heart.org This information is not intended to replace advice given to you by your health care provider. Make sure you discuss any questions you have  with your health care provider. Document Revised: 06/21/2022 Document Reviewed: 06/21/2022 Elsevier Patient Education  2024 [employer].

## 2023-09-22 NOTE — Assessment & Plan Note (Signed)
 Managed by Dr. Morene Sorrow (nephrology). Current use of labetalol BID S/p kidney transplant x1, use of prograf  and myfortic Managed by [facility] Reviewed labs results on patient's mobile device: CMP (stable renal function) BP Readings from Last 3 Encounters:  09/22/23 134/82  08/31/23 (!) 166/105  01/19/22 136/82   Continue f/up with nephrology and [facility] Check CBC, vit. D, and TSH

## 2023-09-22 NOTE — Assessment & Plan Note (Signed)
 Chronic, waxing and waning, no SOB or PND or CP No known hx of CHF Use of torsemide  prn mild Le edema present at this visit (L>R) Consider echocardiogram if any change

## 2023-10-03 ENCOUNTER — Other Ambulatory Visit: Payer: Self-pay | Admitting: Nurse Practitioner

## 2023-10-03 DIAGNOSIS — E782 Mixed hyperlipidemia: Secondary | ICD-10-CM

## 2023-10-05 ENCOUNTER — Encounter: Payer: Self-pay | Admitting: Nurse Practitioner

## 2023-10-05 NOTE — Telephone Encounter (Signed)
 Patient is scheduled for lab on 10/14/23. I called and left a voice message. When patient calls back I will explain about the refills.

## 2023-10-05 NOTE — Telephone Encounter (Signed)
 Called patient and informed her of why I was calling. I informed her that Roselie Mood, NP will give her additional refills once she completes her labs. She informed me that she is scheduled for 10/14/23. She asked with her possible being out of medication a couple of days prior to lab appointment would it interfere with the labs being done? I informed her that I will pass that question to [place]. She also asked if she could come in earlier on the scheduled day if her dental appointment is done sooner of scheduled appointment time. I informed her that she can and if the lab is busy she may be informed that she may have to wait in the lobby until time permits and/or, be asked to come back at scheduled time. She verbalized understanding and all (if any) questions were answered. She thanked me for calling her back.

## 2023-10-13 LAB — HM MAMMOGRAPHY

## 2023-10-14 ENCOUNTER — Other Ambulatory Visit (INDEPENDENT_AMBULATORY_CARE_PROVIDER_SITE_OTHER)

## 2023-10-14 DIAGNOSIS — E559 Vitamin D deficiency, unspecified: Secondary | ICD-10-CM | POA: Diagnosis not present

## 2023-10-14 DIAGNOSIS — E782 Mixed hyperlipidemia: Secondary | ICD-10-CM

## 2023-10-14 DIAGNOSIS — D631 Anemia in chronic kidney disease: Secondary | ICD-10-CM

## 2023-10-14 DIAGNOSIS — N1831 Chronic kidney disease, stage 3a: Secondary | ICD-10-CM | POA: Diagnosis not present

## 2023-10-14 LAB — LIPID PANEL
Cholesterol: 185 mg/dL (ref 0–200)
HDL: 49.3 mg/dL (ref >39.00)
LDL Cholesterol: 118 mg/dL — ABNORMAL HIGH (ref 0–99)
NonHDL: 135.36
Total CHOL/HDL Ratio: 4
Triglycerides: 88 mg/dL (ref 0.0–149.0)
VLDL: 17.6 mg/dL (ref 0.0–40.0)

## 2023-10-14 LAB — CBC WITH DIFFERENTIAL/PLATELET
Basophils Absolute: 0 K/uL (ref 0.0–0.1)
Basophils Relative: 0.4 % (ref 0.0–3.0)
Eosinophils Absolute: 0 K/uL (ref 0.0–0.7)
Eosinophils Relative: 0.9 % (ref 0.0–5.0)
HCT: 36 % (ref 36.0–46.0)
Hemoglobin: 11.8 g/dL — ABNORMAL LOW (ref 12.0–15.0)
Lymphocytes Relative: 40.8 % (ref 12.0–46.0)
Lymphs Abs: 1.9 K/uL (ref 0.7–4.0)
MCHC: 32.9 g/dL (ref 30.0–36.0)
MCV: 95.4 fl (ref 78.0–100.0)
Monocytes Absolute: 0.4 K/uL (ref 0.1–1.0)
Monocytes Relative: 9.6 % (ref 3.0–12.0)
Neutro Abs: 2.2 K/uL (ref 1.4–7.7)
Neutrophils Relative %: 48.3 % (ref 43.0–77.0)
Platelets: 209 K/uL (ref 150.0–400.0)
RBC: 3.77 Mil/uL — ABNORMAL LOW (ref 3.87–5.11)
RDW: 12.6 % (ref 11.5–15.5)
WBC: 4.6 K/uL (ref 4.0–10.5)

## 2023-10-14 LAB — TSH: TSH: 1.36 u[IU]/mL (ref 0.35–5.50)

## 2023-10-14 LAB — VITAMIN D 25 HYDROXY (VIT D DEFICIENCY, FRACTURES): VITD: 79.7 ng/mL (ref 30.00–100.00)

## 2023-10-15 ENCOUNTER — Ambulatory Visit: Payer: Self-pay | Admitting: Nurse Practitioner

## 2023-10-15 DIAGNOSIS — R600 Localized edema: Secondary | ICD-10-CM

## 2023-10-15 DIAGNOSIS — E782 Mixed hyperlipidemia: Secondary | ICD-10-CM

## 2023-10-15 MED ORDER — OMEPRAZOLE 20 MG PO CPDR: 20.0000 mg | DELAYED_RELEASE_CAPSULE | Freq: Every day | ORAL | 1 refills | Status: AC

## 2023-10-15 MED ORDER — TORSEMIDE 20 MG PO TABS: 20.0000 mg | ORAL_TABLET | Freq: Every day | ORAL | 5 refills | Status: AC | PRN

## 2023-10-15 MED ORDER — ATORVASTATIN CALCIUM 10 MG PO TABS
10.0000 mg | ORAL_TABLET | Freq: Every day | ORAL | 3 refills | Status: AC
Start: 2023-10-15 — End: ?

## 2023-10-16 LAB — HM PAP SMEAR

## 2023-11-01 ENCOUNTER — Ambulatory Visit: Admitting: Dermatology

## 2023-11-01 ENCOUNTER — Encounter: Payer: Self-pay | Admitting: Dermatology

## 2023-11-01 VITALS — BP 161/91 | HR 88 | Temp 98.2°F

## 2023-11-01 DIAGNOSIS — C44529 Squamous cell carcinoma of skin of other part of trunk: Secondary | ICD-10-CM

## 2023-11-01 DIAGNOSIS — C4492 Squamous cell carcinoma of skin, unspecified: Secondary | ICD-10-CM

## 2023-11-01 MED ORDER — MUPIROCIN 2 % EX OINT: 1.0000 | TOPICAL_OINTMENT | Freq: Two times a day (BID) | CUTANEOUS | 0 refills | Status: DC

## 2023-11-01 MED ORDER — TRAMADOL HCL 50 MG PO TABS: 50.0000 mg | ORAL_TABLET | Freq: Four times a day (QID) | ORAL | 0 refills | Status: DC | PRN

## 2023-11-01 NOTE — Patient Instructions (Signed)

## 2023-11-01 NOTE — Progress Notes (Signed)
   Follow-Up Visit   Subjective  Carolyn Chang is a 53 y.o. 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 Patient was seen by Dr. Alm on 08/31/2023 for the biopsy. Patient states that it was itchy and painful before the biopsy.   Patient states that the lesion has been there for over a year.  She first went to her PCP who referred her to another doctor, but she was not sure the specialty. She asked her OB-GYN who referred her to Bernarda Ned, MD who is a Manufacturing engineer. Dr. Ned then referred to Dr. Alm.   The following portions of the chart were reviewed this encounter and updated as appropriate: medications, allergies, medical history  Review of Systems:  No other skin or systemic complaints except as noted in HPI or Assessment and Plan.  Objective  Well appearing patient in no apparent distress; mood and affect are within normal limits.  A focused examination was performed of the following areas: Gluteal crease Relevant physical exam findings are noted in the Assessment and Plan.   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 excision  Excision method:  elliptical Lesion length (cm):  2.2 Lesion width (cm):  1.3 Margin per side (cm):  0.5 Total excision diameter (cm):  3.2 Informed consent: discussed and consent obtained   Timeout: patient name, date of birth, surgical site, and procedure verified   Procedure prep:  Patient was prepped and draped in usual sterile fashion Prep type:  Chlorhexidine Anesthesia: the lesion was anesthetized in a standard fashion   Anesthetic:  1% lidocaine w/ epinephrine 1-100,000 buffered w/ 8.4% NaHCO3 Instrument used: #15 blade   Hemostasis achieved with: electrodesiccation   Outcome: patient tolerated procedure well with no complications   Post-procedure details: sterile dressing applied and wound care  instructions given   Dressing type: pressure dressing and petrolatum    Specimen 1 - Surgical pathology Differential Diagnosis: CIS [phone] Check Margins: No Related Medications traMADol (ULTRAM) 50 MG tablet Take 1 tablet (50 mg total) by mouth every 6 (six) hours as needed for up to 8 doses.   Return in about 4 weeks (around 11/29/2023) for wound check .  LILLETTE Rollene Gobble, RN, am acting as scribe for RUFUS CHRISTELLA HOLY, MD .   Documentation: I have reviewed the above documentation for accuracy and completeness, and I agree with the above.  RUFUS CHRISTELLA HOLY, MD

## 2023-11-01 NOTE — Progress Notes (Signed)
   Follow-Up Visit   Subjective  Carolyn Chang is a 53 y.o. 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 Patient was seen by Dr. Alm on 08/31/2023 for the biopsy. Patient states that it was itchy and painful before the biopsy.   Patient states that the lesion has been there for over a year.  She first went to her PCP who referred her to another doctor, but she was not sure the specialty. She asked her OB-GYN who referred her to Bernarda Ned, MD who is a Manufacturing engineer. Dr. Ned then referred to Dr. Alm.     The following portions of the chart were reviewed this encounter and updated as appropriate: medications, allergies, medical history  Review of Systems:  No other skin or systemic complaints except as noted in HPI or Assessment and Plan.  Objective  Well appearing patient in no apparent distress; mood and affect are within normal limits.  A focused examination was performed of the following areas: Gluteal crease Relevant physical exam findings are noted in the Assessment and Plan.     Assessment & Plan      No follow-ups on file.  LILLETTE Rollene Gobble, RN, am acting as scribe for RUFUS CHRISTELLA HOLY, MD .   Documentation: I have reviewed the above documentation for accuracy and completeness, and I agree with the above.  RUFUS CHRISTELLA HOLY, MD

## 2023-11-04 LAB — SURGICAL PATHOLOGY

## 2023-11-05 ENCOUNTER — Ambulatory Visit: Payer: Self-pay | Admitting: Dermatology

## 2023-11-10 ENCOUNTER — Ambulatory Visit: Admitting: Dermatology

## 2023-11-10 ENCOUNTER — Encounter: Payer: Self-pay | Admitting: Dermatology

## 2023-11-10 DIAGNOSIS — Z48817 Encounter for surgical aftercare following surgery on the skin and subcutaneous tissue: Secondary | ICD-10-CM

## 2023-11-10 DIAGNOSIS — D099 Carcinoma in situ, unspecified: Secondary | ICD-10-CM

## 2023-11-10 DIAGNOSIS — T1490XD Injury, unspecified, subsequent encounter: Secondary | ICD-10-CM

## 2023-11-10 DIAGNOSIS — Z86007 Personal history of in-situ neoplasm of skin: Secondary | ICD-10-CM

## 2023-11-10 MED ORDER — MUPIROCIN 2 % EX OINT: 1.0000 | TOPICAL_OINTMENT | Freq: Two times a day (BID) | CUTANEOUS | 0 refills | Status: DC

## 2023-11-10 NOTE — Patient Instructions (Signed)

## 2023-11-10 NOTE — Progress Notes (Signed)
   Follow Up Visit   Subjective  Carolyn Chang is a 53 y.o. female who presents for the following: follow up from surgery   The patient presents for follow up from surgery for a CIS on the gluteal crease, treated on 11/01/23, repaired with 2nd intention. The patient has been bandaging the wound as directed. The endorse the following concerns: sore  The following portions of the chart were reviewed this encounter and updated as appropriate: medications, allergies, medical history  Review of Systems:  No other skin or systemic complaints except as noted in HPI or Assessment and Plan.  Objective  Well appearing patient in no apparent distress; mood and affect are within normal limits.  A focal examination was performed including scalp, head, face and gluteal crease. All findings within normal limits unless otherwise noted below.  Healing wound with mild erythema  Relevant physical exam findings are noted in the Assessment and Plan.    Assessment & Plan   Healing Wound s/p excision of CIS, treated on 11/01/23, repaired with 2nd intention - Reassured that wound is healing well - No evidence of infection - No swelling, induration, purulence, dehiscence, or tenderness out of proportion to the clinical exam, see photo above - Discussed that scars take up to 12 months to mature from the date of surgery - Ok to continue ointment daily to wound under a bandage for another few week  HISTORY OF SQUAMOUS CELL CARCINOMA IN SITU OF THE SKIN - No evidence of recurrence today - Recommend regular full body skin exams - Recommend daily broad spectrum sunscreen SPF 30+ to sun-exposed areas, reapply every 2 hours as needed.  - Call if any new or changing lesions are noted between office visits  Return in about 4 weeks (around 12/08/2023) for wound check.  I, Darice Smock, CMA, am acting as scribe for RUFUS CHRISTELLA HOLY, MD.   Documentation: I have reviewed the above documentation for accuracy and  completeness, and I agree with the above.  RUFUS CHRISTELLA HOLY, MD

## 2023-11-30 ENCOUNTER — Ambulatory Visit: Admitting: Dermatology

## 2023-12-01 ENCOUNTER — Ambulatory Visit: Admitting: Dermatology

## 2023-12-08 ENCOUNTER — Encounter: Payer: Self-pay | Admitting: Dermatology

## 2023-12-08 ENCOUNTER — Ambulatory Visit: Admitting: Dermatology

## 2023-12-08 VITALS — BP 152/96 | HR 82

## 2023-12-08 DIAGNOSIS — T1490XD Injury, unspecified, subsequent encounter: Secondary | ICD-10-CM

## 2023-12-08 DIAGNOSIS — D099 Carcinoma in situ, unspecified: Secondary | ICD-10-CM

## 2023-12-08 DIAGNOSIS — Z48817 Encounter for surgical aftercare following surgery on the skin and subcutaneous tissue: Secondary | ICD-10-CM | POA: Diagnosis not present

## 2023-12-08 DIAGNOSIS — Z86007 Personal history of in-situ neoplasm of skin: Secondary | ICD-10-CM | POA: Diagnosis not present

## 2023-12-08 MED ORDER — MUPIROCIN 2 % EX OINT: TOPICAL_OINTMENT | Freq: Two times a day (BID) | CUTANEOUS | 2 refills | Status: DC

## 2023-12-08 NOTE — Patient Instructions (Signed)

## 2023-12-08 NOTE — Progress Notes (Signed)
   Follow Up Visit   Subjective  Carolyn Chang is a 53 y.o. female who presents for the following: follow up from Excision   The patient presents for follow up from Excision for a CIS on the gluteal crease, treated on 11/01/23, repaired with second intention. The patient has been bandaging the wound as directed. The endorse the following concerns: no questions or concerns at this time.  The following portions of the chart were reviewed this encounter and updated as appropriate: medications, allergies, medical history  Review of Systems:  No other skin or systemic complaints except as noted in HPI or Assessment and Plan.  Objective  Well appearing patient in no apparent distress; mood and affect are within normal limits.  A focal examination was performed including scalp, head, face and gluteal crease. All findings within normal limits unless otherwise noted below.  Healing wound with mild erythema  Relevant physical exam findings are noted in the Assessment and Plan.    Assessment & Plan   Healing Wound s/p excision for CIS on the gluteal crease, treated on 11/01/23, repaired with second intention  - Reassured that wound is healing well - No evidence of infection - No swelling, induration, purulence, dehiscence, or tenderness out of proportion to the clinical exam, see photo above - Discussed that scars take up to 12 months to mature from the date of surgery - Recommend SPF 30+ to scar daily to prevent purple color from UV exposure during scar maturation process - Discussed that erythema and raised appearance of scar will fade over the next 4-6 months - OK to start scar massage at 4-6 weeks post-op - Can consider silicone based products for scar healing starting at 6 weeks post-op - Ok to continue ointment daily to wound under a bandage for another 1 week  HISTORY OF SQUAMOUS CELL CARCINOMA IN SITU OF THE SKIN - No evidence of recurrence today - Recommend regular full body skin  exams - Recommend daily broad spectrum sunscreen SPF 30+ to sun-exposed areas, reapply every 2 hours as needed.  - Call if any new or changing lesions are noted between office visits  Return in about 3 months (around 03/08/2024) for TBSC with Dr. Alm .  I, Berwyn Lesches, Surg Tech III, am acting as scribe for RUFUS CHRISTELLA HOLY, MD.   Documentation: I have reviewed the above documentation for accuracy and completeness, and I agree with the above.  RUFUS CHRISTELLA HOLY, MD

## 2024-02-03 LAB — CBC: RBC: 3.96 (ref 3.87–5.11)

## 2024-02-03 LAB — BASIC METABOLIC PANEL WITH GFR
BUN: 16 (ref 4–21)
CO2: 24 — AB (ref 13–22)
Chloride: 102 (ref 99–108)
Creatinine: 1.1 (ref 0.5–1.1)
Glucose: 74
Potassium: 4.3 meq/L (ref 3.5–5.1)
Sodium: 139 (ref 137–147)

## 2024-02-03 LAB — COMPREHENSIVE METABOLIC PANEL WITH GFR
Albumin: 4.3 (ref 3.5–5.0)
Calcium: 10.2 (ref 8.7–10.7)
Globulin: 3.3
eGFR: 60

## 2024-02-03 LAB — CBC AND DIFFERENTIAL
HCT: 38 (ref 36–46)
Hemoglobin: 12.6 (ref 12.0–16.0)
Platelets: 210 10*3/uL (ref 150–400)
WBC: 3.6

## 2024-02-03 LAB — HEPATIC FUNCTION PANEL
ALT: 11 U/L (ref 7–35)
AST: 17 (ref 13–35)
Alkaline Phosphatase: 88 (ref 25–125)
Bilirubin, Total: 0.4

## 2024-03-24 ENCOUNTER — Ambulatory Visit: Admitting: Nurse Practitioner

## 2024-04-04 ENCOUNTER — Ambulatory Visit: Payer: Self-pay | Admitting: Nurse Practitioner

## 2024-04-04 ENCOUNTER — Encounter: Payer: Self-pay | Admitting: Nurse Practitioner

## 2024-04-04 ENCOUNTER — Ambulatory Visit: Admitting: Nurse Practitioner

## 2024-04-04 VITALS — BP 136/82 | HR 98 | Temp 98.0°F | Ht 63.0 in | Wt 198.4 lb

## 2024-04-04 DIAGNOSIS — R600 Localized edema: Secondary | ICD-10-CM

## 2024-04-04 DIAGNOSIS — D631 Anemia in chronic kidney disease: Secondary | ICD-10-CM | POA: Diagnosis not present

## 2024-04-04 DIAGNOSIS — Z23 Encounter for immunization: Secondary | ICD-10-CM | POA: Diagnosis not present

## 2024-04-04 DIAGNOSIS — E782 Mixed hyperlipidemia: Secondary | ICD-10-CM | POA: Diagnosis not present

## 2024-04-04 DIAGNOSIS — I150 Renovascular hypertension: Secondary | ICD-10-CM

## 2024-04-04 DIAGNOSIS — N1831 Chronic kidney disease, stage 3a: Secondary | ICD-10-CM | POA: Diagnosis not present

## 2024-04-04 LAB — LIPID PANEL
Cholesterol: 161 mg/dL (ref 28–200)
HDL: 46.6 mg/dL
LDL Cholesterol: 97 mg/dL (ref 10–99)
NonHDL: 114.57
Total CHOL/HDL Ratio: 3
Triglycerides: 90 mg/dL (ref 10.0–149.0)
VLDL: 18 mg/dL (ref 0.0–40.0)

## 2024-04-04 NOTE — Assessment & Plan Note (Signed)
 Repeat lipid panel  Advised about need for mediterranean diet Maintain atorvastatin  dose

## 2024-04-04 NOTE — Assessment & Plan Note (Signed)
 Managed by Dr. Roanna. Lab results requested

## 2024-04-04 NOTE — Assessment & Plan Note (Addendum)
 Managed by Dr. Morene Sorrow. Lab results requested

## 2024-04-04 NOTE — Assessment & Plan Note (Signed)
 No Le edema noted today Advised to use torsemide  10-20mg  prn

## 2024-04-04 NOTE — Progress Notes (Signed)
 "               Established Patient Visit  Patient: Carolyn Chang   DOB: 05/05/1970   53 y.o. Female  MRN: [id number] Visit Date: 04/04/2024  Subjective:    Chief Complaint  Patient presents with   Follow-up    FASTING 6 month follow for Hyperlipidemia Requested for Pap and [facility]    HPI Anemia in chronic kidney disease Managed by Dr. Morene Sorrow. Lab results requested  Renovascular hypertension Managed by Dr. Sorrow. Lab results requested  Mixed hyperlipidemia Repeat lipid panel  Advised about need for mediterranean diet Maintain atorvastatin  dose  Bilateral leg edema No Le edema noted today Advised to use torsemide  10-20mg  prn  Reviewed medical, surgical, and social history today  Medications: Show/hide medication list[1] Reviewed past medical and social history.   ROS per HPI above      Objective:  BP 136/82 (BP Location: Left Arm, Patient Position: Sitting, Cuff Size: Normal)   Pulse 98   Temp 98 F (36.7 C) (Oral)   Ht 5' 3 (1.6 m)   Wt 198 lb 6.4 oz (90 kg)   LMP 03/22/2015   SpO2 97%   BMI 35.14 kg/m      Physical Exam Vitals and nursing note reviewed.  Cardiovascular:     Rate and Rhythm: Normal rate and regular rhythm.     Pulses: Normal pulses.     Heart sounds: Normal heart sounds.  Pulmonary:     Effort: Pulmonary effort is normal.     Breath sounds: Normal breath sounds.  Musculoskeletal:     Right lower leg: No edema.     Left lower leg: No edema.  Neurological:     Mental Status: She is alert and oriented to person, place, and time.  Psychiatric:        Mood and Affect: Mood normal.        Behavior: Behavior normal.        Thought Content: Thought content normal.     No results found for any visits on 04/04/24.    Assessment & Plan:    Problem List Items Addressed This Visit     Anemia in chronic kidney disease   Managed by Dr. Morene Sorrow. Lab results requested      Bilateral  leg edema   No Le edema noted today Advised to use torsemide  10-20mg  prn      Mixed hyperlipidemia - Primary   Repeat lipid panel  Advised about need for mediterranean diet Maintain atorvastatin  dose      Relevant Orders   Lipid panel   Renovascular hypertension   Managed by Dr. Sorrow. Lab results requested      Other Visit Diagnoses       Need for shingles vaccine       Relevant Orders   Zoster, Recombinant (Shingrix ) (Completed)      Return in about 6 months (around 10/02/2024) for HTN, hyperlipidemia (fasting).     Roselie Mood, NP      [1]  Outpatient Medications Prior to Visit  Medication Sig   aspirin 81 MG tablet Take 81 mg by mouth daily.   atorvastatin  (LIPITOR) 10 MG tablet Take 1 tablet (10 mg total) by mouth daily.   Calcium  Acetate, Phos Binder, (CALCIUM  ACETATE PO) Take by mouth.   divalproex  (DEPAKOTE  ER) 500 MG 24 hr tablet Take 2 tablets (1,000 mg total) by mouth at bedtime.   ferrous sulfate 325 (65 FE)  MG tablet Take 325 mg by mouth daily with breakfast.   labetalol (NORMODYNE) 100 MG tablet Take 200 mg by mouth 2 (two) times daily.   Magnesium 400 MG CAPS Take 1 capsule by mouth. Reported on 04/06/2015   Multiple Vitamin (MULTIVITAMIN) capsule Take 1 capsule by mouth daily.   MYFORTIC 180 MG EC tablet Take by mouth.   omeprazole  (PRILOSEC) 20 MG capsule Take 1 capsule (20 mg total) by mouth daily.   sulfamethoxazole-trimethoprim (BACTRIM) 400-80 MG tablet Take 1 tablet by mouth 3 (three) times a week.   tacrolimus  (PROGRAF ) 1 MG capsule Take 1 mg by mouth 2 (two) times daily.   torsemide  (DEMADEX ) 20 MG tablet Take 1 tablet (20 mg total) by mouth daily as needed.   WEGOVY  2.4 MG/0.75ML SOAJ SMARTSIG:2.4 Milligram(s) SUB-Q Once a Week   Zinc Acetate, Oral, (ZINC ACETATE PO) Take by mouth.   influenza vaccine (FLUCELVAX QUADRIVALENT) 0.5 ML injection Flucelvax Quad 2019-2020 (PF) 60 mcg (15 mcg x 4)/0.5 mL IM syringe  TO BE ADMINISTERED BY  PHARMACIST FOR IMMUNIZATION   [DISCONTINUED] clobetasol  ointment (TEMOVATE ) 0.05 % Apply 1 Application topically 2 (two) times daily. (Patient not taking: Reported on 04/04/2024)   [DISCONTINUED] mupirocin  ointment (BACTROBAN ) 2 % Apply topically 2 (two) times daily. (Patient not taking: Reported on 04/04/2024)   [DISCONTINUED] tacrolimus  (PROTOPIC ) 0.1 % ointment Apply topically daily. (Patient not taking: Reported on 04/04/2024)   [DISCONTINUED] traMADol  (ULTRAM ) 50 MG tablet Take 1 tablet (50 mg total) by mouth every 6 (six) hours as needed for up to 8 doses. (Patient not taking: Reported on 04/04/2024)   No facility-administered medications prior to visit.   "

## 2024-04-04 NOTE — Patient Instructions (Signed)
 Go to lab Maintain Heart healthy diet and daily exercise. Maintain current medications. Fax or email lab results from nephrology  Mediterranean Diet A Mediterranean diet is based on the traditions of countries on the [employer]. It focuses on eating more: Fruits and vegetables. Whole grains, beans, nuts, and seeds. Heart-healthy fats. These are fats that are good for your heart. It involves eating less: Dairy. Meat and eggs. Processed foods with added sugar, salt, and fat. This type of diet can help prevent certain conditions. It can also improve outcomes if you have a long-term (chronic) disease, such as kidney or heart disease. What are tips for following this plan? Reading food labels Check packaged foods for: The serving size. For foods such as rice and pasta, the serving size is the amount of cooked product, not dry. The total fat. Avoid foods with saturated fat or trans fat. Added sugars, such as corn syrup. Shopping  Try to have a balanced diet. Buy a variety of foods, such as: Fresh fruits and vegetables. You may be able to get these from local farmers markets. You can also buy them frozen. Grains, beans, nuts, and seeds. Some of these can be bought in bulk. Fresh seafood. Poultry and eggs. Low-fat dairy products. Buy whole ingredients instead of foods that have already been packaged. If you can't get fresh seafood, buy precooked frozen shrimp or canned fish, such as tuna, salmon, or sardines. Stock your pantry so you always have certain foods on hand, such as olive oil, canned tuna, canned tomatoes, rice, pasta, and beans. Cooking Cook foods with extra-virgin olive oil instead of using butter or other vegetable oils. Have meat as a side dish. Have vegetables or grains as your main dish. This means having meat in small portions or adding small amounts of meat to foods like pasta or stew. Use beans or vegetables instead of meat in common dishes like chili or  lasagna. Try out different cooking methods. Try roasting, broiling, steaming, and sauting vegetables. Add frozen vegetables to soups, stews, pasta, or rice. Add nuts or seeds for added healthy fats and plant protein at each meal. You can add these to yogurt, salads, or vegetable dishes. Marinate fish or vegetables using olive oil, lemon juice, garlic, and fresh herbs. Meal planning Plan to eat a vegetarian meal one day each week. Try to work up to two vegetarian meals, if possible. Eat seafood two or more times a week. Have healthy snacks on hand. These may include: Vegetable sticks with hummus. Greek yogurt. Fruit and nut trail mix. Eat balanced meals. These should include: Fruit: 2-3 servings a day. Vegetables: 4-5 servings a day. Low-fat dairy: 2 servings a day. Fish, poultry, or lean meat: 1 serving a day. Beans and legumes: 2 or more servings a week. Nuts and seeds: 1-2 servings a day. Whole grains: 6-8 servings a day. Extra-virgin olive oil: 3-4 servings a day. Limit red meat and sweets to just a few servings a month. Lifestyle  Try to cook and eat meals with your family. Drink enough fluid to keep your pee (urine) pale yellow. Be active every day. This includes: Aerobic exercise, which is exercise that causes your heart to beat faster. Examples include running and swimming. Leisure activities like gardening, walking, or housework. Get 7-8 hours of sleep each night. Drink red wine if your provider says you can. A glass of wine is 5 oz (150 mL). You may be allowed to have: Up to 1 glass a day if you're female  and not pregnant. Up to 2 glasses a day if you're female. What foods should I eat? Fruits Apples. Apricots. Avocado. Berries. Bananas. Cherries. Dates. Figs. Grapes. Lemons. Melon. Oranges. Peaches. Plums. Pomegranate. Vegetables Artichokes. Beets. Broccoli. Cabbage. Carrots. Eggplant. Green beans. Chard. Kale. Spinach. Onions. Leeks. Peas. Squash. Tomatoes. Peppers.  Radishes. Grains Whole-grain pasta. Brown rice. Bulgur wheat. Polenta. Couscous. Whole-wheat bread. [place] Madeira. Meats and other proteins Beans. Almonds. Sunflower seeds. Pine nuts. Peanuts. Cod. Salmon. Scallops. Shrimp. Tuna. Tilapia. Clams. Oysters. Eggs. Chicken or turkey without skin. Dairy Low-fat milk. Cheese. Greek yogurt. Fats and oils Extra-virgin olive oil. Avocado oil. Grapeseed oil. Beverages Water. Red wine. Herbal tea. Sweets and desserts Greek yogurt with honey. Baked apples. Poached pears. Trail mix. Seasonings and condiments Basil. Cilantro. Coriander. Cumin. Mint. Parsley. Sage. Rosemary. Tarragon. Garlic. Oregano. Thyme. Pepper. Balsamic vinegar. Tahini. Hummus. Tomato sauce. Olives. Mushrooms. The items listed above may not be all the foods and drinks you can have. Talk to a dietitian to learn more. What foods should I limit? This is a list of foods that should be eaten rarely. Fruits Fruit canned in syrup. Vegetables Deep-fried potatoes, like French fries. Grains Packaged pasta or rice dishes. Cereal with added sugar. Snacks with added sugar. Meats and other proteins Beef. Pork. Lamb. Chicken or turkey with skin. Hot dogs. [place]. Dairy Ice cream. Sour cream. Whole milk. Fats and oils Butter. Canola oil. Vegetable oil. Beef fat (tallow). Lard. Beverages Juice. Sugar-sweetened soft drinks. Beer. Liquor and spirits. Sweets and desserts Cookies. Cakes. Pies. Candy. Seasonings and condiments Mayonnaise. Pre-made sauces and marinades. The items listed above may not be all the foods and drinks you should limit. Talk to a dietitian to learn more. Where to find more information American Heart Association (AHA): heart.org This information is not intended to replace advice given to you by your health care provider. Make sure you discuss any questions you have with your health care provider. Document Revised: 06/21/2022 Document Reviewed: 06/21/2022 Elsevier  Patient Education  2024 [employer].

## 2024-04-07 NOTE — Telephone Encounter (Signed)
 Labs abstracted into patient's chart.

## 2024-04-10 ENCOUNTER — Encounter: Payer: Self-pay | Admitting: Nurse Practitioner

## 2024-04-19 ENCOUNTER — Ambulatory Visit: Payer: Self-pay | Admitting: Nurse Practitioner

## 2024-05-09 ENCOUNTER — Ambulatory Visit: Payer: No Typology Code available for payment source | Admitting: Psychiatry

## 2024-05-11 ENCOUNTER — Ambulatory Visit: Admitting: Dermatology

## 2024-10-02 ENCOUNTER — Ambulatory Visit: Admitting: Nurse Practitioner
